# Patient Record
Sex: Male | Born: 1940
Health system: Southern US, Community
[De-identification: ages and names within clinical notes are randomized; demographics above are authoritative.]

## PROBLEM LIST (undated history)

## (undated) DIAGNOSIS — N400 Enlarged prostate without lower urinary tract symptoms: Secondary | ICD-10-CM

## (undated) DIAGNOSIS — I10 Essential (primary) hypertension: Secondary | ICD-10-CM

## (undated) DIAGNOSIS — G8929 Other chronic pain: Secondary | ICD-10-CM

## (undated) DIAGNOSIS — N529 Male erectile dysfunction, unspecified: Secondary | ICD-10-CM

## (undated) DIAGNOSIS — R972 Elevated prostate specific antigen [PSA]: Secondary | ICD-10-CM

## (undated) DIAGNOSIS — M549 Dorsalgia, unspecified: Secondary | ICD-10-CM

## (undated) DIAGNOSIS — R3911 Hesitancy of micturition: Secondary | ICD-10-CM

## (undated) DIAGNOSIS — M1990 Unspecified osteoarthritis, unspecified site: Secondary | ICD-10-CM

## (undated) DIAGNOSIS — R7303 Prediabetes: Secondary | ICD-10-CM

## (undated) HISTORY — DX: Benign prostatic hyperplasia without lower urinary tract symptoms: N40.0

## (undated) HISTORY — DX: Dorsalgia, unspecified: M54.9

## (undated) HISTORY — DX: Other chronic pain: G89.29

## (undated) HISTORY — DX: Elevated prostate specific antigen (PSA): R97.20

## (undated) HISTORY — DX: Essential (primary) hypertension: I10

## (undated) HISTORY — PX: TONSILLECTOMY: SUR1361

## (undated) HISTORY — DX: Male erectile dysfunction, unspecified: N52.9

## (undated) HISTORY — DX: Hesitancy of micturition: R39.11

## (undated) HISTORY — DX: Unspecified osteoarthritis, unspecified site: M19.90

---

## 2006-12-19 ENCOUNTER — Ambulatory Visit: Payer: Self-pay | Admitting: Urology

## 2008-07-16 ENCOUNTER — Ambulatory Visit: Payer: Self-pay | Admitting: Internal Medicine

## 2008-07-18 IMAGING — CT CT ABDOMEN AND PELVIS WITHOUT AND WITH CONTRAST
2 of 4 series · 14 of 32 positions shown, 19 images · non-contrast
Comparison: none

REASON FOR EXAM: hematuria
COMMENTS:

[Series 3: soft tissue with · axial · 0.88mm/px · z∈[-618,-233]mm · 8 of 99 slices shown, 13 images]
[im 11/99  soft-tissue]
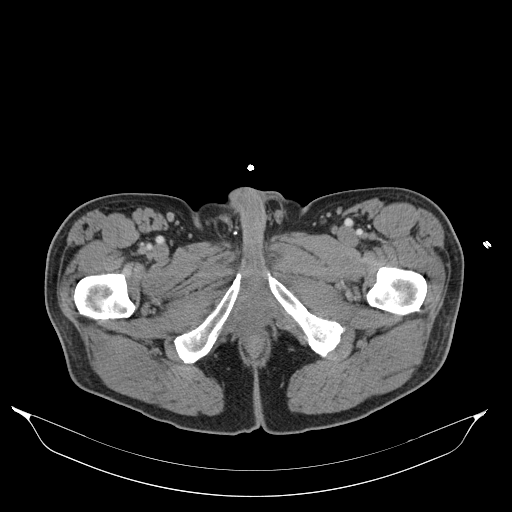
[im 11/99  bone]
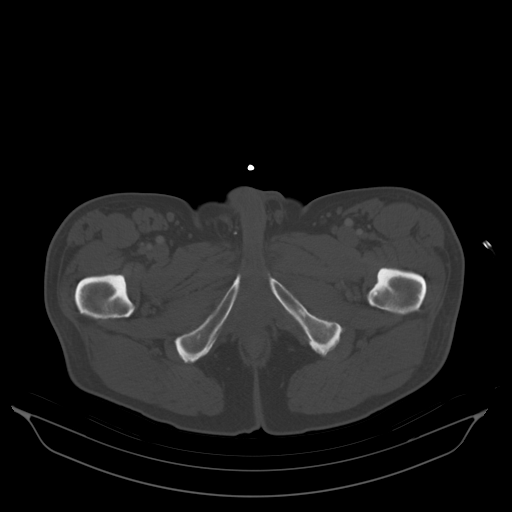
[im 22/99  soft-tissue]
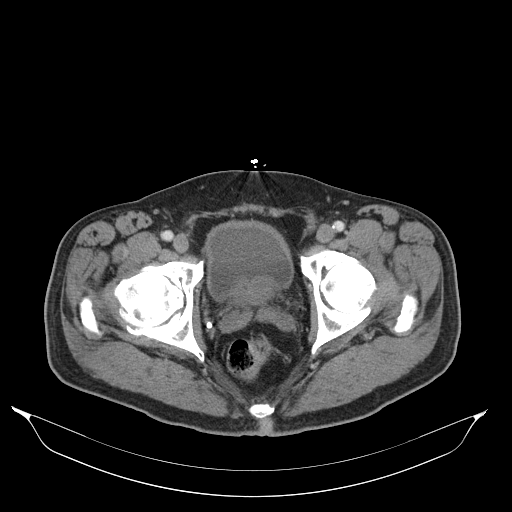
[im 33/99  soft-tissue]
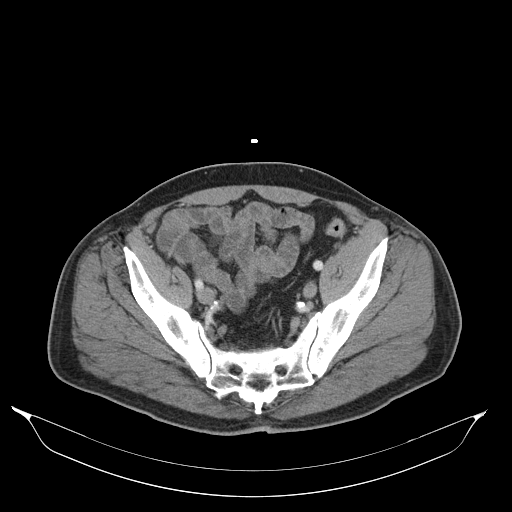
[im 44/99  soft-tissue]
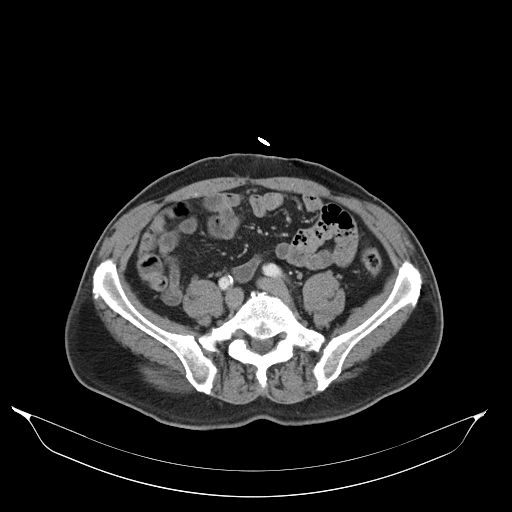
[im 55/99  soft-tissue]
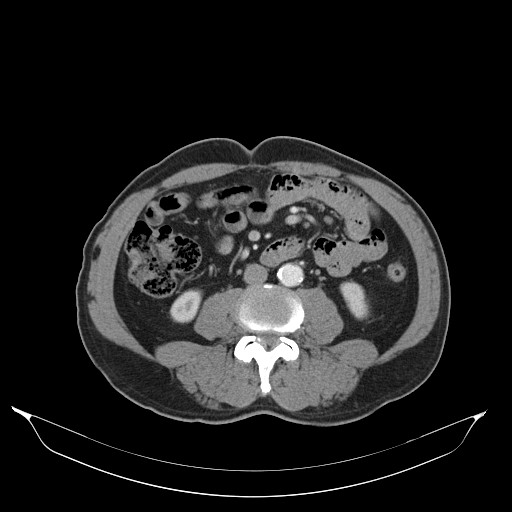
[im 55/99  lung]
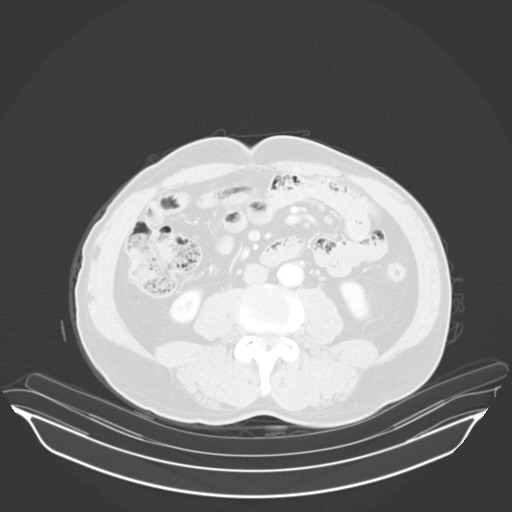
[im 66/99  soft-tissue]
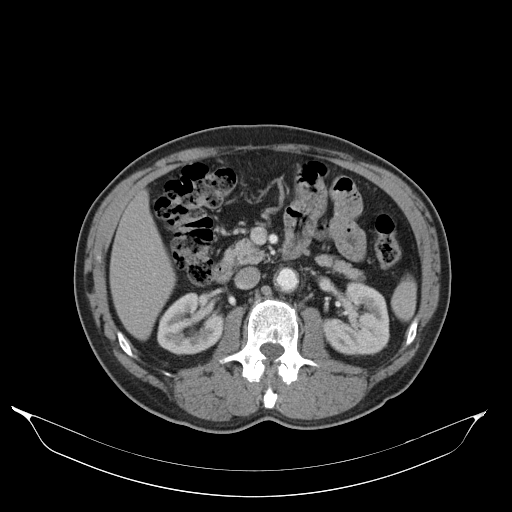
[im 66/99  lung]
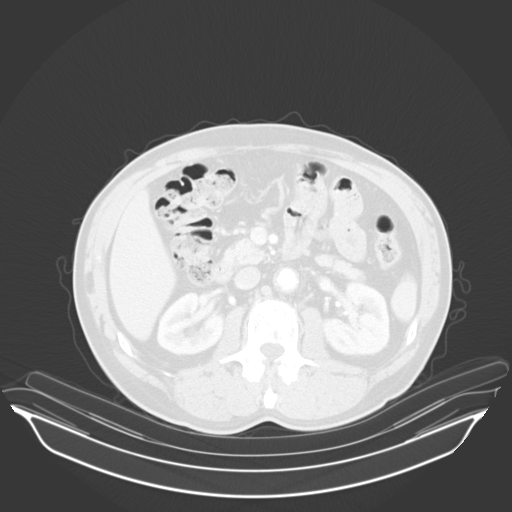
[im 77/99  soft-tissue]
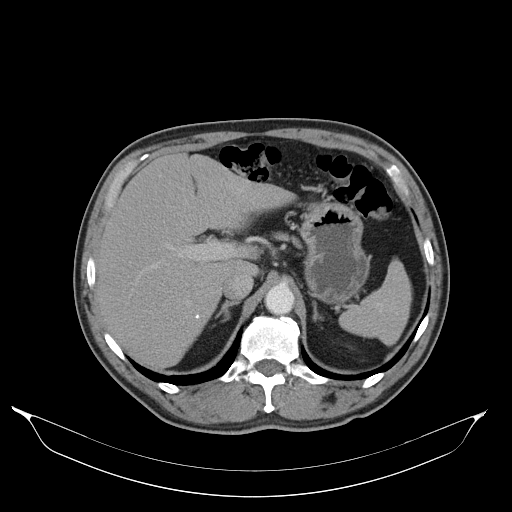
[im 77/99  lung]
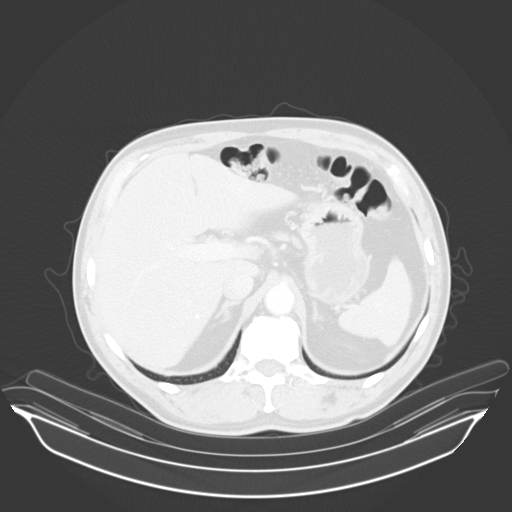
[im 88/99  soft-tissue]
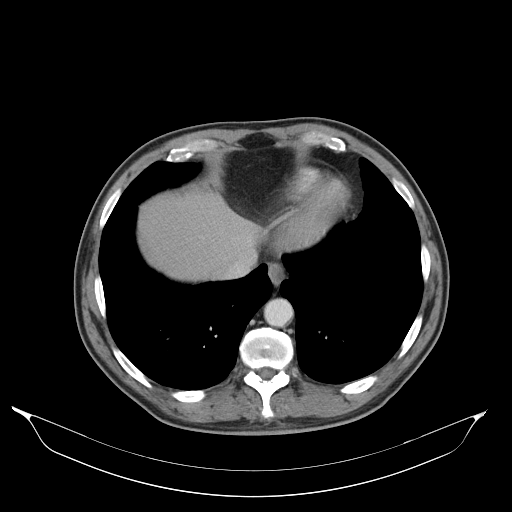
[im 88/99  lung]
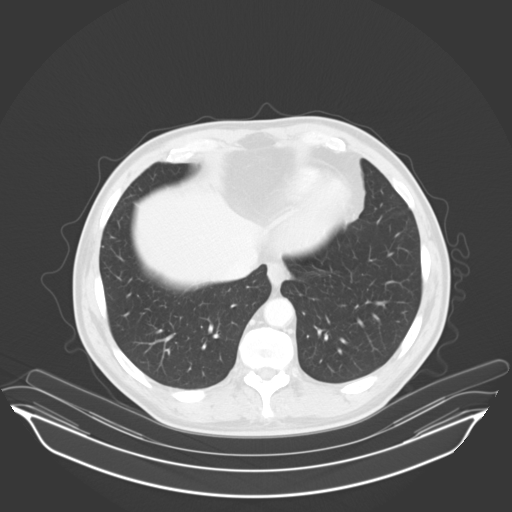

[Series 6: soft tissue delay · axial · delayed · 0.88mm/px · z∈[-618,-343]mm · 6 of 99 slices shown]
[im 11/99  soft-tissue]
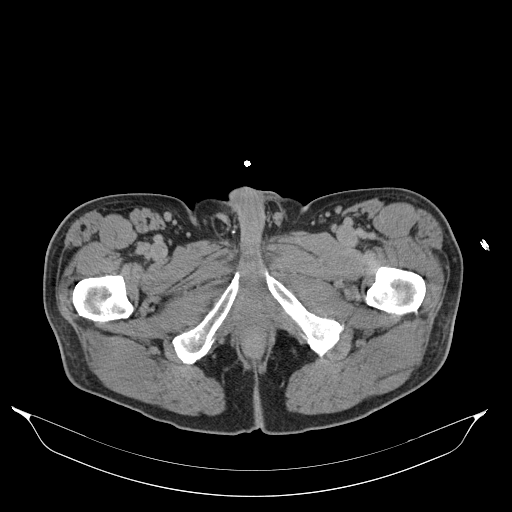
[im 22/99  soft-tissue]
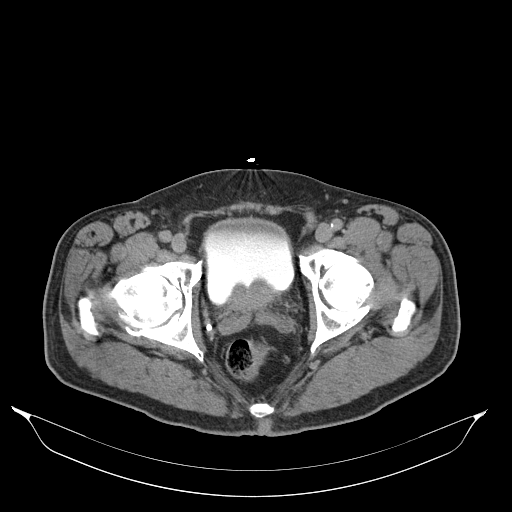
[im 33/99  soft-tissue]
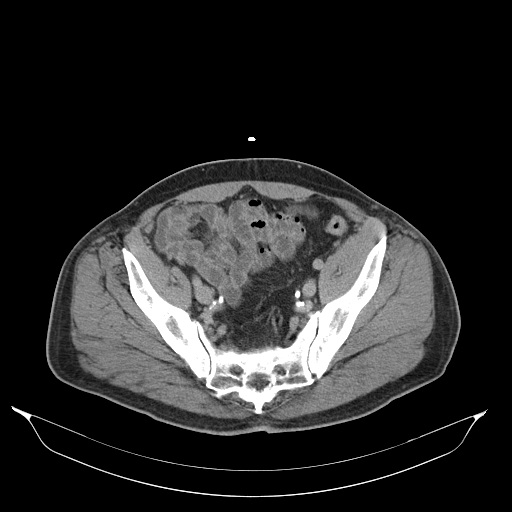
[im 44/99  soft-tissue]
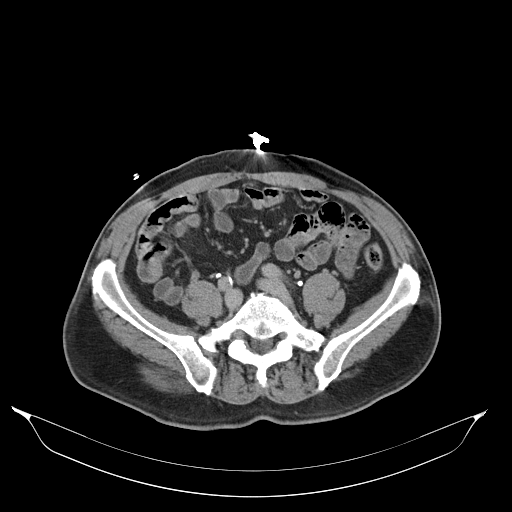
[im 55/99  soft-tissue]
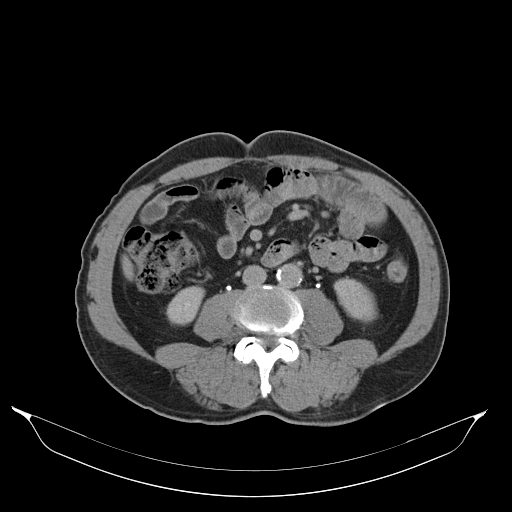
[im 66/99  soft-tissue]
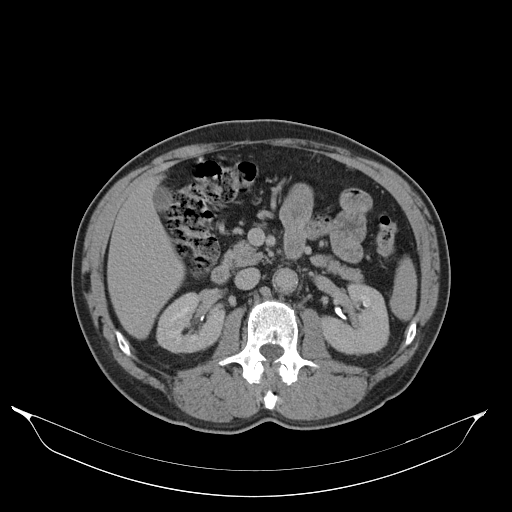

[14 of 32 positions shown; findings below may reference images not displayed]

PROCEDURE:     MARIT JOHANNE - MARIT JOHANNE ABDOMEN / PELVIS W/WO  - December 19, 2006  [DATE]

RESULT:       The patient has unexplained hematuria. On the noncontrast
images, the kidneys are normal in density and contour with no perinephric
inflammatory changes seen. No calcifications are seen within either kidney.
Following contrast administration, the enhancement pattern of the renal
parenchyma bilaterally is normal. On the delayed images, the appearance of
the renal collecting systems is normal bilaterally. The ureters are normal
in course and caliber.

The prostate gland is enlarged and produces a prominent impression upon the
urinary bladder base. The urinary bladder otherwise exhibits no acute
abnormality. There is mild sigmoid diverticulosis with no evidence of acute
diverticulitis. There is no free fluid in the pelvis. The unopacified loops
of small and large bowel exhibit no acute abnormality.

The liver, stomach, spleen, pancreas, and adrenal glands are normal in
appearance. The caliber of the abdominal aorta is within the limits of
normal. There is mild failure to taper of its caliber with calcification
within its walls. The lung bases are clear.
IMPRESSION: 1. I do not see evidence of intrinsic renal abnormality.
2. There is enlargement of the prostate gland which produces a prominent
impression upon the urinary bladder base.
3. There is no evidence of acute hepatobiliary abnormality. The gallbladder
is only partially distended but no calcified stones are seen.
4. No acute bowel abnormality is demonstrated. There is sigmoid
diverticulosis.

## 2014-06-07 ENCOUNTER — Ambulatory Visit: Payer: Self-pay | Admitting: Emergency Medicine

## 2014-06-07 LAB — DOT URINE DIP
Glucose,UR: NEGATIVE
Protein: NEGATIVE
Specific Gravity: 1.015 (ref 1.000–1.030)

## 2014-12-05 ENCOUNTER — Encounter: Payer: Self-pay | Admitting: *Deleted

## 2014-12-08 ENCOUNTER — Encounter: Payer: Self-pay | Admitting: Urology

## 2014-12-08 ENCOUNTER — Ambulatory Visit (INDEPENDENT_AMBULATORY_CARE_PROVIDER_SITE_OTHER): Payer: Medicare Other | Admitting: Urology

## 2014-12-08 VITALS — BP 155/76 | HR 69 | Ht 69.0 in | Wt 181.7 lb

## 2014-12-08 DIAGNOSIS — N401 Enlarged prostate with lower urinary tract symptoms: Secondary | ICD-10-CM | POA: Diagnosis not present

## 2014-12-08 DIAGNOSIS — N138 Other obstructive and reflux uropathy: Secondary | ICD-10-CM

## 2014-12-08 DIAGNOSIS — N529 Male erectile dysfunction, unspecified: Secondary | ICD-10-CM | POA: Insufficient documentation

## 2014-12-08 DIAGNOSIS — R972 Elevated prostate specific antigen [PSA]: Secondary | ICD-10-CM | POA: Diagnosis not present

## 2014-12-08 DIAGNOSIS — N528 Other male erectile dysfunction: Secondary | ICD-10-CM | POA: Diagnosis not present

## 2014-12-08 LAB — BLADDER SCAN AMB NON-IMAGING: Scan Result: 108

## 2014-12-08 MED ORDER — FINASTERIDE 5 MG PO TABS: 5.0000 mg | ORAL_TABLET | Freq: Every day | ORAL | Status: DC

## 2014-12-08 NOTE — Progress Notes (Signed)
12/08/2014 8:42 AM   Austin Ellis 02/04/1941 [id number]  Referring provider: No referring provider defined for this encounter.  Chief Complaint  Patient presents with  . Benign Prostatic Hypertrophy    6 month recheck  . Elevated PSA    HPI: Mr. Stieg is a 75 year old white male with elevated PSA, erectile dysfunction and BPH with LUTS who presents her 6 month follow-up.  His IPS S score today is 11, which is moderate lower urinary tract symptomatology. He is mostly satisfied with his quality life due to his urinary symptoms. His PVR is 108 mL.  His major complaint is in the morning when he wakes up his urinary stream is very weak and he needs to make several trips to the bathroom. As the day progresses his urinary stream becomes stronger and he empties his bladder more efficiently. He is only getting up one time a night which is not bothersome to him at this time. He denies any dysuria, hematuria or suprapubic pain. He also denies any recent fevers, chills, nausea or vomiting.     IPSS      12/08/14 0800       International Prostate Symptom Score   How often have you had the sensation of not emptying your bladder? Less than 1 in 5     How often have you had to urinate less than every two hours? Less than half the time     How often have you found you stopped and started again several times when you urinated? About half the time     How often have you found it difficult to postpone urination? Less than 1 in 5 times     How often have you had a weak urinary stream? About half the time     How often have you had to strain to start urination? Not at All     How many times did you typically get up at night to urinate? 1 Time     Total IPSS Score 11     Quality of Life due to urinary symptoms   If you were to spend the rest of your life with your urinary condition just the way it is now how would you feel about that? Mostly Satisfied        Score:  1-7 Mild 8-19  Moderate 20-35 Severe    He also history of elevated PSA with undergoing a biopsy in January 2009 2015 for a PSA of 5.4 ng/mL. The biopsy was negative.  He does suffer from erectile dysfunction, but his wife's libido is significantly diminished. His SHIM score is 7, which is severe erectile dysfunction. He does not desire any treatment for this condition at this time. His any painful erections or penile curvature.      SHIM      12/08/14 0835       SHIM: Over the last 6 months:   How do you rate your confidence that you could get and keep an erection? Very Low     When you had erections with sexual stimulation, how often were your erections hard enough for penetration (entering your partner)? A Few Times (much less than half the time)     During sexual intercourse, how often were you able to maintain your erection after you had penetrated (entered) your partner? Extremely Difficult     During sexual intercourse, how difficult was it to maintain your erection to completion of intercourse? Extremely Difficult  When you attempted sexual intercourse, how often was it satisfactory for you? Very Difficult     SHIM Total Score   SHIM 7        Score: 1-7 Severe ED 8-11 Moderate ED 12-16 Mild-Moderate ED 17-21 Mild ED 22-25 No ED  PMH: Past Medical History  Diagnosis Date  . Chronic back pain   . Hypertension   . Arthritis   . Elevated PSA     underwent TRUSBx on 05/28/2013 for a PSA of 5.4 ng/ml  . Erectile dysfunction   . Benign enlargement of prostate   . Urinary hesitancy     Surgical History: Past Surgical History  Procedure Laterality Date  . Tonsillectomy      Home Medications:    Medication List       This list is accurate as of: 12/08/14  8:42 AM.  Always use your most recent med list.               hydrochlorothiazide 12.5 MG capsule  Commonly known as:  MICROZIDE  Take 12.5 mg by mouth daily.     ibuprofen 200 MG tablet  Commonly known as:   ADVIL,MOTRIN  Take 200 mg by mouth every 4 (four) hours as needed.     losartan 100 MG tablet  Commonly known as:  COZAAR  Take 100 mg by mouth daily.     tadalafil 5 MG tablet  Commonly known as:  CIALIS  Take 5 mg by mouth daily as needed for erectile dysfunction.     tamsulosin 0.4 MG Caps capsule  Commonly known as:  FLOMAX  Take 0.4 mg by mouth daily.        Allergies:  Allergies  Allergen Reactions  . Cialis [Tadalafil]     Back Pain    Family History: Family History  Problem Relation Age of Onset  . Prostate cancer Father   . Lung cancer Father   . Kidney disease Neg Hx   . Bladder Cancer Neg Hx     Social History:  reports that he has quit smoking. He does not have any smokeless tobacco history on file. He reports that he does not drink alcohol or use illicit drugs.  ROS: UROLOGY Frequent Urination?: No Hard to postpone urination?: No Burning/pain with urination?: No Get up at night to urinate?: No Leakage of urine?: No Urine stream starts and stops?: Yes Trouble starting stream?: No Do you have to strain to urinate?: No Blood in urine?: No Urinary tract infection?: No Sexually transmitted disease?: No Injury to kidneys or bladder?: No Painful intercourse?: No Weak stream?: Yes Erection problems?: Yes Penile pain?: No  Gastrointestinal Nausea?: No Vomiting?: No Indigestion/heartburn?: No Diarrhea?: No Constipation?: No  Constitutional Fever: No Night sweats?: No Weight loss?: No Fatigue?: No  Skin Skin rash/lesions?: No Itching?: No  Eyes Blurred vision?: No Double vision?: No  Ears/Nose/Throat Sore throat?: No Sinus problems?: No  Hematologic/Lymphatic Swollen glands?: No Easy bruising?: No  Cardiovascular Leg swelling?: No Chest pain?: No  Respiratory Cough?: No Shortness of breath?: No  Endocrine Excessive thirst?: No  Musculoskeletal Back pain?: Yes Joint pain?: No  Neurological Headaches?: No Dizziness?:  No  Psychologic Depression?: No Anxiety?: No  Physical Exam: BP 155/76 mmHg  Pulse 69  Ht 5\' 9"  (1.753 m)  Wt 181 lb 11.2 oz (82.419 kg)  BMI 26.82 kg/m2  GU: Patient with uncircumcised phallus. Foreskin easily retracted  Urethral meatus is patent.  No penile discharge. No penile lesions or rashes.  Scrotum without lesions, cysts, rashes and/or edema.  Testicles are located scrotally bilaterally. No masses are appreciated in the testicles. Left and right epididymis are normal.  Rectal: Patient with  normal sphincter tone. Perineum without scarring or rashes. No rectal masses are appreciated. Prostate is approximately 60 grams, no nodules are appreciated. Seminal vesicles are normal.   Laboratory Data: Results for orders placed or performed in visit on 12/08/14  BLADDER SCAN AMB NON-IMAGING  Result Value Ref Range   Scan Result 108    No results found for: WBC, HGB, HCT, MCV, PLT  No results found for: CREATININE  No results found for: PSA  No results found for: TESTOSTERONE  No results found for: HGBA1C  Urinalysis    Component Value Date/Time   LABSPEC 1.015 06/07/2014 [place] 06/07/2014 [place] 06/07/2014 [place] 06/07/2014 0735    Pertinent Imaging:   Assessment & Plan:    1. BPH (benign prostatic hyperplasia) with LUTS:   Patient's IPSS score is 11/2.  His PVR 108.  His DRE demonstrates enlargement.  We will add finasteride to his tamsulosin.  The side effects of dutasteride are also discussed with the patient, such as: impotence, loss of interest in sex, or trouble having an orgasm; abnormal ejaculation; swelling in his hands and/or feet; swelling and/or tenderness in his breasts.  He will follow up in 6 months for a PSA, DRE, PVR and an IPSS.    - PSA - BLADDER SCAN AMB NON-IMAGING  2. Elevated PSA:   He also history of elevated PSA with undergoing a biopsy in January 2009 2015 for a PSA of 5.4 ng/mL. The biopsy  was negative.  We will continue to monitor closely with PSA's every 6 months.  PSA History:    5.4 ng/mL on 05/28/2013- neg bx    4.2 ng/mL on 12/07/2013    4.0 ng/mL on 06/09/2014  3. Erectile dysfunction:   SHIM score is 7.  He does not desire any treatment for this condition at this time. His wife is not interested in sexual activity.  No Follow-up on file.  Zara Council, [facility] [address] [place], [place] [address] [phone]

## 2014-12-09 ENCOUNTER — Telehealth: Payer: Self-pay

## 2014-12-09 DIAGNOSIS — R972 Elevated prostate specific antigen [PSA]: Secondary | ICD-10-CM

## 2014-12-09 LAB — PSA: Prostate Specific Ag, Serum: 4.2 ng/mL — ABNORMAL HIGH (ref 0.0–4.0)

## 2014-12-09 NOTE — Telephone Encounter (Signed)
LMOM with lab results. Orders have been placed for PSA.

## 2014-12-09 NOTE — Telephone Encounter (Signed)
-----   Message from Nori Riis, PA-C sent at 12/09/2014  8:31 AM EDT ----- Patient's PSA is stable.  We will see him in 6 months.  PSA to be drawn before his next appointment.

## 2015-06-06 ENCOUNTER — Other Ambulatory Visit: Payer: Medicare Other

## 2015-06-06 DIAGNOSIS — R972 Elevated prostate specific antigen [PSA]: Secondary | ICD-10-CM

## 2015-06-07 LAB — PSA: Prostate Specific Ag, Serum: 1.9 ng/mL (ref 0.0–4.0)

## 2015-06-13 ENCOUNTER — Encounter: Payer: Self-pay | Admitting: Urology

## 2015-06-13 ENCOUNTER — Ambulatory Visit (INDEPENDENT_AMBULATORY_CARE_PROVIDER_SITE_OTHER): Payer: Medicare Other | Admitting: Urology

## 2015-06-13 VITALS — BP 160/83 | HR 75 | Ht 69.0 in | Wt 181.6 lb

## 2015-06-13 DIAGNOSIS — Z87898 Personal history of other specified conditions: Secondary | ICD-10-CM

## 2015-06-13 DIAGNOSIS — N528 Other male erectile dysfunction: Secondary | ICD-10-CM | POA: Diagnosis not present

## 2015-06-13 DIAGNOSIS — N529 Male erectile dysfunction, unspecified: Secondary | ICD-10-CM

## 2015-06-13 DIAGNOSIS — N138 Other obstructive and reflux uropathy: Secondary | ICD-10-CM

## 2015-06-13 DIAGNOSIS — N401 Enlarged prostate with lower urinary tract symptoms: Secondary | ICD-10-CM

## 2015-06-13 NOTE — Progress Notes (Signed)
11:09 AM   Austin Ellis 1941/02/05 Austin Ellis  Referring provider: Lynnell Jude, MD [address] Austin Ellis, Austin Ellis Austin Ellis  Chief Complaint  Patient presents with  . Benign Prostatic Hypertrophy    6 month follow up  . Erectile Dysfunction    HPI: Austin Ellis is a 75 year old white male with elevated PSA, erectile dysfunction and BPH with LUTS who presents his 6 month follow-up.  History of elevated PSA He also history of elevated PSA with undergoing a biopsy in January 2009 2015 for a PSA of 5.4 ng/mL. The biopsy was negative.  His father was diagnosed with prostate cancer.   His most recent PSA is 1.9 ng/mL on 06/06/2015.  BPH WITH LUTS His IPSS score today is 20, which is severe lower urinary tract symptomatology. He is mixed with his quality life due to his urinary symptoms. His PVR is 92 mL.  His previous IPSS score was 11/2.Marland Kitchen  His previous PVR is 108 mL.  His major complaint today is nocturia 1, urinary intermittency and a weak urinary stream.   These symptoms have worsened over the last year.  He denies any dysuria, hematuria or suprapubic pain.   He currently taking tamsulosin 0.4 mg and finasteride 5 mg daily.  He also denies any recent fevers, chills, nausea or vomiting.        IPSS      06/13/15 1000       International Prostate Symptom Score   How often have you had the sensation of not emptying your bladder? More than half the time     How often have you had to urinate less than every two hours? More than half the time     How often have you found you stopped and started again several times when you urinated? More than half the time     How often have you found it difficult to postpone urination? Less than half the time     How often have you had a weak urinary stream? More than half the time     How often have you had to strain to start urination? Less than 1 in 5 times     How many times did you typically get up at night to urinate? 1 Time     Total IPSS  Score 20     Quality of Life due to urinary symptoms   If you were to spend the rest of your life with your urinary condition just the way it is now how would you feel about that? Mixed        Score:  1-7 Mild 8-19 Moderate 20-35 Severe    Erectile dysfunction His SHIM score is 11, which is moderate erectile dysfunction.   He has been having difficulty with erections for the last three years.   His major complaint is maintaining an erection.  His libido is preserved.   His risk factors for ED are age, hypertension and BPH.  He denies any painful erections or curvatures with his erections.   He has tried Cialis in the past and found it effective. It is cost prohibitive.        SHIM      06/13/15 1044       SHIM: Over the last 6 months:   How do you rate your confidence that you could get and keep an erection? Low     When you had erections with sexual stimulation, how often were your erections  hard enough for penetration (entering your partner)? A Few Times (much less than half the time)     During sexual intercourse, how often were you able to maintain your erection after you had penetrated (entered) your partner? Very Difficult     During sexual intercourse, how difficult was it to maintain your erection to completion of intercourse? Very Difficult     When you attempted sexual intercourse, how often was it satisfactory for you? Difficult     SHIM Total Score   SHIM 11        Score: 1-7 Severe ED 8-11 Moderate ED 12-16 Mild-Moderate ED 17-21 Mild ED 22-25 No ED   PMH: Past Medical History  Diagnosis Date  . Chronic back pain   . Hypertension   . Arthritis   . Elevated PSA     underwent TRUSBx on 05/28/2013 for a PSA of 5.4 ng/ml  . Erectile dysfunction   . Benign enlargement of prostate   . Urinary hesitancy     Surgical History: Past Surgical History  Procedure Laterality Date  . Tonsillectomy      Home Medications:    Medication List       This  list is accurate as of: 06/13/15 11:09 AM.  Always use your most recent med list.               finasteride 5 MG tablet  Commonly known as:  PROSCAR  Take 1 tablet (5 mg total) by mouth daily.     ibuprofen 200 MG tablet  Commonly known as:  ADVIL,MOTRIN  Take 200 mg by mouth every 4 (four) hours as needed.     losartan-hydrochlorothiazide 100-12.5 MG tablet  Commonly known as:  HYZAAR     tamsulosin 0.4 MG Caps capsule  Commonly known as:  FLOMAX  Take 0.4 mg by mouth daily.        Allergies:  Allergies  Allergen Reactions  . Cialis [Tadalafil]     Back Pain    Family History: Family History  Problem Relation Age of Onset  . Prostate cancer Father   . Lung cancer Father   . Kidney disease Neg Hx   . Bladder Cancer Neg Hx     Social History:  reports that he has quit smoking. He does not have any smokeless tobacco history on file. He reports that he does not drink alcohol or use illicit drugs.  ROS: UROLOGY Frequent Urination?: No Hard to postpone urination?: No Burning/pain with urination?: No Get up at night to urinate?: Yes Leakage of urine?: No Urine stream starts and stops?: Yes Trouble starting stream?: No Do you have to strain to urinate?: No Blood in urine?: No Urinary tract infection?: Yes Sexually transmitted disease?: No Injury to kidneys or bladder?: No Painful intercourse?: No Weak stream?: Yes Erection problems?: Yes Penile pain?: No  Gastrointestinal Nausea?: No Vomiting?: No Indigestion/heartburn?: No Diarrhea?: No Constipation?: No  Constitutional Fever: No Night sweats?: No Weight loss?: No Fatigue?: Yes  Skin Skin rash/lesions?: No Itching?: No  Eyes Blurred vision?: No Double vision?: No  Ears/Nose/Throat Sore throat?: No Sinus problems?: No  Hematologic/Lymphatic Swollen glands?: No Easy bruising?: No  Cardiovascular Leg swelling?: No Chest pain?: No  Respiratory Cough?: No Shortness of breath?:  No  Endocrine Excessive thirst?: No  Musculoskeletal Back pain?: Yes Joint pain?: Yes  Neurological Headaches?: No Dizziness?: No  Psychologic Depression?: No Anxiety?: No  Physical Exam: BP 160/83 mmHg  Pulse 75  Ht 5\' 9"  (1.753 m)  Wt 181 lb 9.6 oz (82.373 kg)  BMI 26.81 kg/m2  GU: Patient with uncircumcised phallus. Foreskin easily retracted  Urethral meatus is patent.  No penile discharge. No penile lesions or rashes. Scrotum without lesions, cysts, rashes and/or edema.  Testicles are located scrotally bilaterally. No masses are appreciated in the testicles. Left and right epididymis are normal. Rectal: Patient with  normal sphincter tone. Perineum without scarring or rashes. No rectal masses are appreciated. Prostate is approximately 60 grams, no nodules are appreciated. Seminal vesicles are normal.   Laboratory Data: Results for orders placed or performed in visit on 06/06/15  PSA  Result Value Ref Range   Prostate Specific Ag, Serum 1.9 0.0 - 4.0 ng/mL   PSA History:    5.4 ng/mL on 05/28/2013- neg bx    4.2 ng/mL on 12/07/2013    4.0 ng/mL on 06/09/2014   Pertinent Imaging: Results for TIP, PANZICA (MRN Austin Ellis) as of 06/21/2015 11:50  Ref. Range 06/19/2015 08:18  Scan Result Unknown 92    Assessment & Plan:    1. History of elevated PSA:   He also history of elevated PSA with undergoing a biopsy in January 2009 2015 for a PSA of 5.4 ng/mL. The biopsy was negative.  His father was diagnosed with prostate cancer.  His most recent PSA was 1.9 ng/mL on 06/06/2015. We will continue to monitor closely with PSA's and exams every 6 months.  2. BPH (benign prostatic hyperplasia) with LUTS:   Patient's IPSS score is 20/3.  His PVR 92 mL.   he is still having obstructive urinary symptoms in spite of being on maximal medicinal therapy with tamsulosin and finasteride. He would like to undergo cystoscopic examination for evaluation for a possible bladder outlet  obstruction procedure.  - BLADDER SCAN AMB NON-IMAGING   3. Erectile dysfunction:   SHIM score is 11.  He finds PDE 5 inhibitors cost prohibitive. He is not interested in any other treatment modality at this time.  We will continue to monitor with SHIM score and exam in 6 months.    Return for cystoscopy.  Zara Council, [facility] [address], [place], [place] [address] [phone]

## 2015-06-19 LAB — BLADDER SCAN AMB NON-IMAGING: Scan Result: 92

## 2015-06-27 ENCOUNTER — Other Ambulatory Visit: Payer: Medicare Other

## 2015-11-23 ENCOUNTER — Telehealth: Payer: Self-pay | Admitting: Urology

## 2015-11-23 DIAGNOSIS — N400 Enlarged prostate without lower urinary tract symptoms: Secondary | ICD-10-CM

## 2015-11-23 NOTE — Telephone Encounter (Signed)
Pt needs Finasteride refill to [facility] in [place].  He will run out before his next appt on 7/31.

## 2015-11-28 MED ORDER — FINASTERIDE 5 MG PO TABS: 5.0000 mg | ORAL_TABLET | Freq: Every day | ORAL | Status: DC

## 2015-11-28 NOTE — Telephone Encounter (Signed)
Medication sent to pt pharmacy 

## 2015-12-17 NOTE — Progress Notes (Signed)
8:48 AM   Austin Ellis 06-Feb-1941 [id number]  Referring provider: Lynnell Jude, MD [address] [place], [place] [id number]  Chief Complaint  Patient presents with  . Benign Prostatic Hypertrophy    6 month follow up  . Elevated PSA    HPI: Mr. Austin Ellis is a 75 year old white male with elevated PSA, erectile dysfunction and BPH with LUTS who presents his 6 month follow-up.  History of elevated PSA He also history of elevated PSA with undergoing a biopsy in January 2009 2015 for a PSA of 5.4 ng/mL. The biopsy was negative.  His father was diagnosed with prostate cancer.   His most recent PSA is 1.9 ng/mL on 06/06/2015.  BPH WITH LUTS  His IPSS score today is 19, which is moderate lower urinary tract symptomatology. He is mostly satisfied with his quality life due to his urinary symptoms.  His previous IPSS score was 20/3.  His previous PVR is 92 mL.  His major complaint today is urinary intermittency and a weak urinary stream.   These symptoms have worsened over the last year.  He denies any dysuria, hematuria or suprapubic pain.   He currently taking doxazosin 4 mg and finasteride 5 mg daily.  He feels like he doesn't want to do anything.  He would like to go back to the tamsulosin 0.4 mg daily.  He also denies any recent fevers, chills, nausea or vomiting.  His father was diagnosed with prostate cancer, non-lethal.  Patient was to undergo a cystoscopic examination to evaluate for BOO, but this was not performed due to not wanting to proceed with outlet procedure.      IPSS    Row Name 12/18/15 0800         International Prostate Symptom Score   How often have you had the sensation of not emptying your bladder? About half the time     How often have you had to urinate less than every two hours? More than half the time     How often have you found you stopped and started again several times when you urinated? More than half the time     How often have you found it difficult to  postpone urination? Less than half the time     How often have you had a weak urinary stream? More than half the time     How often have you had to strain to start urination? Less than 1 in 5 times     How many times did you typically get up at night to urinate? 1 Time     Total IPSS Score 19       Quality of Life due to urinary symptoms   If you were to spend the rest of your life with your urinary condition just the way it is now how would you feel about that? Mostly Satisfied        Score:  1-7 Mild 8-19 Moderate 20-35 Severe    Erectile dysfunction His SHIM score is 11, which is moderat erectile dysfunction.   He previous SHIM was 11.   He has been having difficulty with erections for the last four years.   His major complaint is maintaining an erection.  His libido is preserved.   His risk factors for ED are age, hypertension and BPH.  He denies any painful erections or curvatures with his erections.   He has tried Cialis in the past and found it effective. It is  cost prohibitive.        SHIM    Row Name 12/18/15 [phone]         SHIM: Over the last 6 months:   How do you rate your confidence that you could get and keep an erection? Low     When you had erections with sexual stimulation, how often were your erections hard enough for penetration (entering your partner)? A Few Times (much less than half the time)     During sexual intercourse, how often were you able to maintain your erection after you had penetrated (entered) your partner? Very Difficult     During sexual intercourse, how difficult was it to maintain your erection to completion of intercourse? Very Difficult     When you attempted sexual intercourse, how often was it satisfactory for you? Difficult       SHIM Total Score   SHIM 11        Score: 1-7 Severe ED 8-11 Moderate ED 12-16 Mild-Moderate ED 17-21 Mild ED 22-25 No ED   PMH: Past Medical History:  Diagnosis Date  . Arthritis   . Benign  enlargement of prostate   . Chronic back pain   . Elevated PSA    underwent TRUSBx on 05/28/2013 for a PSA of 5.4 ng/ml  . Erectile dysfunction   . Hypertension   . Urinary hesitancy     Surgical History: Past Surgical History:  Procedure Laterality Date  . TONSILLECTOMY      Home Medications:    Medication List       Accurate as of 12/18/15  8:48 AM. Always use your most recent med list.          doxazosin 4 MG tablet Commonly known as:  CARDURA   finasteride 5 MG tablet Commonly known as:  PROSCAR Take 1 tablet (5 mg total) by mouth daily.   ibuprofen 200 MG tablet Commonly known as:  ADVIL,MOTRIN Take 200 mg by mouth every 4 (four) hours as needed.   losartan-hydrochlorothiazide 100-12.5 MG tablet Commonly known as:  HYZAAR   tamsulosin 0.4 MG Caps capsule Commonly known as:  FLOMAX Take 1 capsule (0.4 mg total) by mouth daily.       Allergies:  Allergies  Allergen Reactions  . Cialis [Tadalafil]     Back Pain    Family History: Family History  Problem Relation Age of Onset  . Prostate cancer Father   . Lung cancer Father   . Kidney disease Neg Hx   . Bladder Cancer Neg Hx     Social History:  reports that he has quit smoking. He has never used smokeless tobacco. He reports that he does not drink alcohol or use drugs.  ROS: UROLOGY Frequent Urination?: No Hard to postpone urination?: No Burning/pain with urination?: No Get up at night to urinate?: No Leakage of urine?: No Urine stream starts and stops?: Yes Trouble starting stream?: No Do you have to strain to urinate?: No Blood in urine?: No Urinary tract infection?: No Sexually transmitted disease?: No Injury to kidneys or bladder?: No Painful intercourse?: No Weak stream?: Yes Erection problems?: No Penile pain?: No  Gastrointestinal Nausea?: No Vomiting?: No Indigestion/heartburn?: No Diarrhea?: No Constipation?: No  Constitutional Fever: No Night sweats?: No Weight  loss?: No Fatigue?: Yes  Skin Skin rash/lesions?: No Itching?: No  Eyes Blurred vision?: No Double vision?: No  Ears/Nose/Throat Sore throat?: No Sinus problems?: No  Hematologic/Lymphatic Swollen glands?: No Easy bruising?: No  Cardiovascular Leg swelling?:  No Chest pain?: No  Respiratory Cough?: No Shortness of breath?: No  Endocrine Excessive thirst?: No  Musculoskeletal Back pain?: No Joint pain?: Yes  Neurological Headaches?: No Dizziness?: No  Psychologic Depression?: No Anxiety?: No  Physical Exam: BP (!) 147/69   Pulse 75   Ht 5\' 9"  (1.753 m)   Wt 189 lb 9.6 oz (86 kg)   BMI 28.00 kg/m   Constitutional: Well nourished. Alert and oriented, No acute distress. HEENT: [place] AT, moist mucus membranes. Trachea midline, no masses. Cardiovascular: No clubbing, cyanosis, or edema. Respiratory: Normal respiratory effort, no increased work of breathing. GI: Abdomen is soft, non tender, non distended, no abdominal masses. Liver and spleen not palpable.  No hernias appreciated.  Stool sample for occult testing is not indicated.   GU: No CVA tenderness.  No bladder fullness or masses.  Patient with uncircumcised phallus. Foreskin easily retracted Urethral meatus is patent.  No penile discharge. No penile lesions or rashes. Scrotum without lesions, cysts, rashes and/or edema.  Testicles are located scrotally bilaterally. No masses are appreciated in the testicles. Left and right epididymis are normal. Rectal: Patient with  normal sphincter tone. Anus and perineum without scarring or rashes. No rectal masses are appreciated. Prostate is approximately 55 grams, firm, no nodules are appreciated. Seminal vesicles are normal. Skin: No rashes, bruises or suspicious lesions. Lymph: No cervical or inguinal adenopathy. Neurologic: Grossly intact, no focal deficits, moving all 4 extremities. Psychiatric: Normal mood and affect.  Laboratory Data: PSA History:    5.4 ng/mL  on 05/28/2013- neg bx    4.2 ng/mL on 12/07/2013    4.0 ng/mL on 06/09/2014    1.9 ng/mL on 06/06/2015    Assessment & Plan:    1. History of elevated PSA:   He also history of elevated PSA with undergoing a biopsy in January 2015 for a PSA of 5.4 ng/mL. The biopsy was negative.  His father was diagnosed with prostate cancer, non-lethal.  His most recent PSA was 1.9 ng/mL on 06/06/2015.  We will continue to monitor closely with PSA's and exams every 6 months.  PSA drawn today.    2. Family history of prostate cancer:   See above.    3. BPH with LUTS  - IPSS score is 19/2, it is stable  - Continue conservative management, avoiding bladder irritants and timed voiding's  - Continue finasteride 5 mg daily  - Discontinue doxazosin, restart tamsulosin 0.4 mg daily  - Cannot tolerate medication or medication failure, schedule cystoscopy  - RTC in 6 months for IPSS, PSA and exam   4. Erectile dysfunction:     - SHIM score is 11  - Continue Cialis, not sexually active at this time  - RTC in 6 months for repeat SHIM score and exam  Return in about 6 months (around 06/19/2016) for IPSS, PSA and exam.  Zara Council, [facility]  [place] [address] [place], [place] [address] [phone]

## 2015-12-18 ENCOUNTER — Encounter: Payer: Self-pay | Admitting: Urology

## 2015-12-18 ENCOUNTER — Ambulatory Visit (INDEPENDENT_AMBULATORY_CARE_PROVIDER_SITE_OTHER): Payer: Medicare Other | Admitting: Urology

## 2015-12-18 VITALS — BP 147/69 | HR 75 | Ht 69.0 in | Wt 189.6 lb

## 2015-12-18 DIAGNOSIS — Z87898 Personal history of other specified conditions: Secondary | ICD-10-CM | POA: Diagnosis not present

## 2015-12-18 DIAGNOSIS — N401 Enlarged prostate with lower urinary tract symptoms: Secondary | ICD-10-CM | POA: Diagnosis not present

## 2015-12-18 DIAGNOSIS — N138 Other obstructive and reflux uropathy: Secondary | ICD-10-CM

## 2015-12-18 DIAGNOSIS — Z8042 Family history of malignant neoplasm of prostate: Secondary | ICD-10-CM

## 2015-12-18 DIAGNOSIS — N529 Male erectile dysfunction, unspecified: Secondary | ICD-10-CM

## 2015-12-18 DIAGNOSIS — N528 Other male erectile dysfunction: Secondary | ICD-10-CM | POA: Diagnosis not present

## 2015-12-18 MED ORDER — TAMSULOSIN HCL 0.4 MG PO CAPS: 0.4000 mg | ORAL_CAPSULE | Freq: Every day | ORAL | 4 refills | Status: DC

## 2015-12-19 ENCOUNTER — Telehealth: Payer: Self-pay | Admitting: *Deleted

## 2015-12-19 LAB — PSA: Prostate Specific Ag, Serum: 1.6 ng/mL (ref 0.0–4.0)

## 2015-12-19 NOTE — Telephone Encounter (Signed)
-----   Message from Nori Riis, PA-C sent at 12/19/2015  7:51 AM EDT ----- Please notify the patient that his PSA is stable.  We will see him in 6 months.

## 2015-12-19 NOTE — Telephone Encounter (Signed)
Spoke with patient and gave results. Follow up 6 months. Patient ok with plan and appointment already made.

## 2016-06-11 ENCOUNTER — Other Ambulatory Visit: Payer: Self-pay

## 2016-06-11 DIAGNOSIS — N401 Enlarged prostate with lower urinary tract symptoms: Secondary | ICD-10-CM

## 2016-06-12 ENCOUNTER — Other Ambulatory Visit: Payer: PPO

## 2016-06-12 DIAGNOSIS — N401 Enlarged prostate with lower urinary tract symptoms: Secondary | ICD-10-CM | POA: Diagnosis not present

## 2016-06-13 LAB — PSA: PROSTATE SPECIFIC AG, SERUM: 2.1 ng/mL (ref 0.0–4.0)

## 2016-06-18 NOTE — Progress Notes (Signed)
8:46 AM   [name] September 07, 1940 [id number]  Referring provider: Lynnell Jude, MD [address] [place], [place] [id number]  Chief Complaint  Patient presents with  . Benign Prostatic Hypertrophy    6 month follow up   . Elevated PSA    HPI: Mr. Merfeld is a 76 year old Caucasian male with elevated PSA, erectile dysfunction and BPH with LUTS who presents his 6 month follow-up.  History of elevated PSA Patient underwent a biopsy in January 2009 2015 for a PSA of 5.4 ng/mL. The biopsy was negative.  His father was diagnosed with prostate cancer.   His most recent PSA is 2.1 ng/mL on 06/12/2016.    BPH WITH LUTS  His IPSS score today is 15, which is moderate lower urinary tract symptomatology. He is mixed with his quality life due to his urinary symptoms.  His PVR is 0 mL.  His previous IPSS score was 19/2.  His previous PVR is 92 mL.  His major complaints today are urinary intermittency, hesitancy and a weak urinary stream.   These symptoms have worsened over the two years.  He denies any dysuria, hematuria or suprapubic pain.   He is currently taking tamsulosin 0.4 mg and finasteride 5 mg daily.  He also denies any recent fevers, chills, nausea or vomiting.  His father was diagnosed with prostate cancer, non-lethal.  Patient was to undergo a cystoscopic examination to evaluate for BOO, but this was not performed due to not wanting to proceed with outlet procedure.     IPSS    Row Name 06/19/16 0800         International Prostate Symptom Score   How often have you had the sensation of not emptying your bladder? About half the time     How often have you had to urinate less than every two hours? About half the time     How often have you found you stopped and started again several times when you urinated? About half the time     How often have you found it difficult to postpone urination? Less than 1 in 5 times     How often have you had a weak urinary stream? More than half the  time     How often have you had to strain to start urination? Not at All     How many times did you typically get up at night to urinate? 1 Time     Total IPSS Score 15       Quality of Life due to urinary symptoms   If you were to spend the rest of your life with your urinary condition just the way it is now how would you feel about that? Mixed        Score:  1-7 Mild 8-19 Moderate 20-35 Severe   Erectile dysfunction His SHIM score is 14, which is mild to moderat erectile dysfunction.   He previous SHIM was 11.   He has been having difficulty with erections for the last four years.   His major complaint is maintaining an erection.  His libido is preserved.   His risk factors for ED are age, hypertension and BPH.  He denies any painful erections or curvatures with his erections.   He has tried Cialis in the past and found it effective. It is cost prohibitive.     SHIM    Row Name 06/19/16 0834         SHIM: Over  the last 6 months:   How do you rate your confidence that you could get and keep an erection? Very Low     When you had erections with sexual stimulation, how often were your erections hard enough for penetration (entering your partner)? A Few Times (much less than half the time)     During sexual intercourse, how often were you able to maintain your erection after you had penetrated (entered) your partner? Difficult     During sexual intercourse, how difficult was it to maintain your erection to completion of intercourse? Slightly Difficult     When you attempted sexual intercourse, how often was it satisfactory for you? Slightly Difficult       SHIM Total Score   SHIM 14        Score: 1-7 Severe ED 8-11 Moderate ED 12-16 Mild-Moderate ED 17-21 Mild ED 22-25 No ED   PMH: Past Medical History:  Diagnosis Date  . Arthritis   . Benign enlargement of prostate   . Chronic back pain   . Elevated PSA    underwent TRUSBx on 05/28/2013 for a PSA of 5.4 ng/ml  .  Erectile dysfunction   . Hypertension   . Urinary hesitancy     Surgical History: Past Surgical History:  Procedure Laterality Date  . TONSILLECTOMY      Home Medications:  Allergies as of 06/19/2016      Reactions   Cialis [tadalafil]    Back Pain      Medication List       Accurate as of 06/19/16  8:46 AM. Always use your most recent med list.          acetaminophen 325 MG tablet Commonly known as:  TYLENOL Take 650 mg by mouth every 6 (six) hours as needed.   doxazosin 4 MG tablet Commonly known as:  CARDURA   finasteride 5 MG tablet Commonly known as:  PROSCAR Take 1 tablet (5 mg total) by mouth daily.   ibuprofen 200 MG tablet Commonly known as:  ADVIL,MOTRIN Take 200 mg by mouth every 4 (four) hours as needed.   losartan-hydrochlorothiazide 100-12.5 MG tablet Commonly known as:  HYZAAR   tamsulosin 0.4 MG Caps capsule Commonly known as:  FLOMAX Take 1 capsule (0.4 mg total) by mouth daily.       Allergies:  Allergies  Allergen Reactions  . Cialis [Tadalafil]     Back Pain    Family History: Family History  Problem Relation Age of Onset  . Prostate cancer Father   . Lung cancer Father   . Kidney disease Neg Hx   . Bladder Cancer Neg Hx     Social History:  reports that he has quit smoking. He has never used smokeless tobacco. He reports that he does not drink alcohol or use drugs.  ROS: UROLOGY Frequent Urination?: No Hard to postpone urination?: No Burning/pain with urination?: No Get up at night to urinate?: No Leakage of urine?: No Urine stream starts and stops?: Yes Trouble starting stream?: Yes Do you have to strain to urinate?: No Blood in urine?: No Urinary tract infection?: No Sexually transmitted disease?: No Injury to kidneys or bladder?: No Painful intercourse?: No Weak stream?: Yes Erection problems?: No Penile pain?: No  Gastrointestinal Nausea?: No Vomiting?: No Indigestion/heartburn?: No Diarrhea?:  No Constipation?: No  Constitutional Fever: No Night sweats?: No Weight loss?: No Fatigue?: No  Skin Skin rash/lesions?: No Itching?: No  Eyes Blurred vision?: No Double vision?: No  Ears/Nose/Throat  Sore throat?: No Sinus problems?: No  Hematologic/Lymphatic Swollen glands?: No Easy bruising?: No  Cardiovascular Leg swelling?: No Chest pain?: No  Respiratory Cough?: No Shortness of breath?: No  Endocrine Excessive thirst?: No  Musculoskeletal Back pain?: No Joint pain?: No  Neurological Headaches?: No Dizziness?: No  Psychologic Depression?: No Anxiety?: No  Physical Exam: BP (!) 142/71   Pulse 72   Ht 5\' 9"  (1.753 m)   Wt 191 lb 3.2 oz (86.7 kg)   BMI 28.24 kg/m   Constitutional: Well nourished. Alert and oriented, No acute distress. HEENT: [place] AT, moist mucus membranes. Trachea midline, no masses. Cardiovascular: No clubbing, cyanosis, or edema. Respiratory: Normal respiratory effort, no increased work of breathing. GI: Abdomen is soft, non tender, non distended, no abdominal masses. Liver and spleen not palpable.  No hernias appreciated.  Stool sample for occult testing is not indicated.   GU: No CVA tenderness.  No bladder fullness or masses.  Patient with uncircumcised phallus. Foreskin easily retracted  Urethral meatus is patent.  No penile discharge. No penile lesions or rashes. Scrotum without lesions, cysts, rashes and/or edema.  Testicles are located scrotally bilaterally. No masses are appreciated in the testicles. Left and right epididymis are normal. Rectal: Patient with  normal sphincter tone. Anus and perineum without scarring or rashes. No rectal masses are appreciated. Prostate is approximately 55 grams, firm, no nodules are appreciated. Seminal vesicles are normal. Skin: No rashes, bruises or suspicious lesions. Lymph: No cervical or inguinal adenopathy. Neurologic: Grossly intact, no focal deficits, moving all 4  extremities. Psychiatric: Normal mood and affect.  Laboratory Data: PSA History:     5.4 ng/mL on 05/28/2013- neg bx     4.2 ng/mL on 12/07/2013     4.0 ng/mL on 06/09/2014     1.9 ng/mL on 06/06/2015     1.6 ng/mL on 12/18/2015  2.1 ng/mL on 06/12/2016  Assessment & Plan:    1. History of elevated PSA  - underwent a biopsy in January 2015 for a PSA of 5.4 ng/mL - negative  - most recent PSA was 2.1 ng/mL on 06/12/2016  - RTC in 6 months for PSA and exam  2. Family history of prostate cancer  - father was diagnosed with non-lethal prostate cancer  3. BPH with LUTS  - IPSS score is 15/3, it is worsening  - Continue conservative management, avoiding bladder irritants and timed voiding's  - Continue finasteride 5 mg and tamsulosin 0.4 mg daily; refills given  - Also on Cardura 4, 1/2 daily by PCP  - RTC in 6 months for IPSS, PSA and exam   4. Erectile dysfunction:     - SHIM score is 14  - not sexually active at this time   Return in about 6 months (around 12/17/2016) for PSA, IPSS, SHIM and exam.  Zara Council, [facility]  [place] [address] [place],  [address] [phone]

## 2016-06-19 ENCOUNTER — Ambulatory Visit: Payer: PPO | Admitting: Urology

## 2016-06-19 ENCOUNTER — Other Ambulatory Visit: Payer: Self-pay | Admitting: Family Medicine

## 2016-06-19 ENCOUNTER — Encounter: Payer: Self-pay | Admitting: Urology

## 2016-06-19 VITALS — BP 142/71 | HR 72 | Ht 69.0 in | Wt 191.2 lb

## 2016-06-19 DIAGNOSIS — Z8042 Family history of malignant neoplasm of prostate: Secondary | ICD-10-CM | POA: Diagnosis not present

## 2016-06-19 DIAGNOSIS — N138 Other obstructive and reflux uropathy: Secondary | ICD-10-CM | POA: Diagnosis not present

## 2016-06-19 DIAGNOSIS — N529 Male erectile dysfunction, unspecified: Secondary | ICD-10-CM

## 2016-06-19 DIAGNOSIS — R972 Elevated prostate specific antigen [PSA]: Secondary | ICD-10-CM

## 2016-06-19 DIAGNOSIS — Z87898 Personal history of other specified conditions: Secondary | ICD-10-CM | POA: Diagnosis not present

## 2016-06-19 DIAGNOSIS — N400 Enlarged prostate without lower urinary tract symptoms: Secondary | ICD-10-CM

## 2016-06-19 DIAGNOSIS — N401 Enlarged prostate with lower urinary tract symptoms: Secondary | ICD-10-CM

## 2016-06-19 LAB — BLADDER SCAN AMB NON-IMAGING: SCAN RESULT: 0

## 2016-06-19 MED ORDER — TAMSULOSIN HCL 0.4 MG PO CAPS: 0.4000 mg | ORAL_CAPSULE | Freq: Every day | ORAL | 3 refills | Status: DC

## 2016-06-19 MED ORDER — FINASTERIDE 5 MG PO TABS: 5.0000 mg | ORAL_TABLET | Freq: Every day | ORAL | 3 refills | Status: DC

## 2016-09-12 DIAGNOSIS — Z23 Encounter for immunization: Secondary | ICD-10-CM | POA: Diagnosis not present

## 2016-09-12 DIAGNOSIS — N400 Enlarged prostate without lower urinary tract symptoms: Secondary | ICD-10-CM | POA: Diagnosis not present

## 2016-09-12 DIAGNOSIS — Z79899 Other long term (current) drug therapy: Secondary | ICD-10-CM | POA: Diagnosis not present

## 2016-09-12 DIAGNOSIS — H9193 Unspecified hearing loss, bilateral: Secondary | ICD-10-CM | POA: Diagnosis not present

## 2016-09-12 DIAGNOSIS — Z1389 Encounter for screening for other disorder: Secondary | ICD-10-CM | POA: Diagnosis not present

## 2016-09-12 DIAGNOSIS — I10 Essential (primary) hypertension: Secondary | ICD-10-CM | POA: Diagnosis not present

## 2016-09-12 DIAGNOSIS — Z6828 Body mass index (BMI) 28.0-28.9, adult: Secondary | ICD-10-CM | POA: Diagnosis not present

## 2016-09-12 DIAGNOSIS — F17211 Nicotine dependence, cigarettes, in remission: Secondary | ICD-10-CM | POA: Diagnosis not present

## 2016-09-12 DIAGNOSIS — Z0001 Encounter for general adult medical examination with abnormal findings: Secondary | ICD-10-CM | POA: Diagnosis not present

## 2016-10-07 DIAGNOSIS — Z1212 Encounter for screening for malignant neoplasm of rectum: Secondary | ICD-10-CM | POA: Diagnosis not present

## 2016-10-07 DIAGNOSIS — Z1211 Encounter for screening for malignant neoplasm of colon: Secondary | ICD-10-CM | POA: Diagnosis not present

## 2016-11-26 DIAGNOSIS — H2513 Age-related nuclear cataract, bilateral: Secondary | ICD-10-CM | POA: Diagnosis not present

## 2016-12-05 ENCOUNTER — Other Ambulatory Visit
Admission: RE | Admit: 2016-12-05 | Discharge: 2016-12-05 | Disposition: A | Discharge status: Home / Self Care | Payer: PPO | Source: Ambulatory Visit | Attending: Urology | Admitting: Urology

## 2016-12-05 DIAGNOSIS — R972 Elevated prostate specific antigen [PSA]: Secondary | ICD-10-CM

## 2016-12-05 LAB — PSA: Prostatic Specific Antigen: 1.52 ng/mL (ref 0.00–4.00)

## 2016-12-05 NOTE — Addendum Note (Signed)
Addended by: Elmo Putt on: 12/05/2016 08:58 AM   Modules accepted: Orders

## 2016-12-12 NOTE — Progress Notes (Signed)
8:48 AM   Austin Ellis Feb 21, 1941 [id number]  Referring provider: Lynnell Jude, MD [address] [place], [place] [address]  Chief Complaint  Patient presents with  . Benign Prostatic Hypertrophy    6 month follow up  . Elevated PSA    HPI: Mr. Austin Ellis is a 76 year old Caucasian male with a history of elevated PSA, erectile dysfunction and BPH with LUTS who presents his 6 month follow-up.  History of elevated PSA Patient underwent a biopsy in January 2009 2015 for a PSA of 5.4 ng/mL. The biopsy was negative.  His father was diagnosed with prostate cancer.   His most recent PSA is 1.52 ng/mL on 12/05/2016.    BPH WITH LUTS  His IPSS score today is 19, which is moderate lower urinary tract symptomatology. He is mixed with his quality life due to his urinary symptoms.  His PVR is 29 mL.  His previous IPSS score was 15/3.  His previous PVR is 0 mL.  His major complaints today are urinary intermittency and a weak urinary stream.   These symptoms have worsened over the two years.  He denies any dysuria, hematuria or suprapubic pain.   He is currently taking tamsulosin 0.4 mg and finasteride 5 mg daily.  He also denies any recent fevers, chills, nausea or vomiting.  His father was diagnosed with prostate cancer, non-lethal.  Patient was to undergo a cystoscopic examination to evaluate for BOO, but this was not performed due to not wanting to proceed with outlet procedure.     IPSS    Row Name 12/13/16 0800         International Prostate Symptom Score   How often have you had the sensation of not emptying your bladder? About half the time     How often have you had to urinate less than every two hours? About half the time     How often have you found you stopped and started again several times when you urinated? More than half the time     How often have you found it difficult to postpone urination? Less than half the time     How often have you had a weak urinary stream? More than  half the time     How often have you had to strain to start urination? Less than half the time     How many times did you typically get up at night to urinate? 1 Time     Total IPSS Score 19       Quality of Life due to urinary symptoms   If you were to spend the rest of your life with your urinary condition just the way it is now how would you feel about that? Mixed        Score:  1-7 Mild 8-19 Moderate 20-35 Severe   Erectile dysfunction His SHIM score is 5, which is severe erectile dysfunction.   He previous SHIM was 14.   He has been having difficulty with erections for the last four years.   His major complaint is maintaining an erection.  His libido is preserved.   His risk factors for ED are age, hypertension and BPH.  He denies any painful erections or curvatures with his erections.   He has tried Cialis in the past and found it effective. It is cost prohibitive.     Austin Ellis Name 12/13/16 0836         SHIM:  Over the last 6 months:   How do you rate your confidence that you could get and keep an erection? Very Low     When you had erections with sexual stimulation, how often were your erections hard enough for penetration (entering your partner)? Almost Never or Never     During sexual intercourse, how often were you able to maintain your erection after you had penetrated (entered) your partner? Almost Never or Never     During sexual intercourse, how difficult was it to maintain your erection to completion of intercourse? Extremely Difficult     When you attempted sexual intercourse, how often was it satisfactory for you? Almost Never or Never       SHIM Total Score   SHIM 5        Score: 1-7 Severe ED 8-11 Moderate ED 12-16 Mild-Moderate ED 17-21 Mild ED 22-25 No ED   PMH: Past Medical History:  Diagnosis Date  . Arthritis   . Benign enlargement of prostate   . Chronic back pain   . Elevated PSA    underwent TRUSBx on 05/28/2013 for a PSA of 5.4 ng/ml    . Erectile dysfunction   . Hypertension   . Urinary hesitancy     Surgical History: Past Surgical History:  Procedure Laterality Date  . TONSILLECTOMY      Home Medications:  Allergies as of 12/13/2016      Reactions   Cialis [tadalafil]    Back Pain      Medication List       Accurate as of 12/13/16  8:48 AM. Always use your most recent med list.          acetaminophen 325 MG tablet Commonly known as:  TYLENOL Take 650 mg by mouth every 6 (six) hours as needed.   doxazosin 4 MG tablet Commonly known as:  CARDURA 1 mg.   finasteride 5 MG tablet Commonly known as:  PROSCAR Take 1 tablet (5 mg total) by mouth daily.   ibuprofen 200 MG tablet Commonly known as:  ADVIL,MOTRIN Take 200 mg by mouth every 4 (four) hours as needed.   losartan-hydrochlorothiazide 100-12.5 MG tablet Commonly known as:  HYZAAR   tamsulosin 0.4 MG Caps capsule Commonly known as:  FLOMAX Take 1 capsule (0.4 mg total) by mouth daily.       Allergies:  Allergies  Allergen Reactions  . Cialis [Tadalafil]     Back Pain    Family History: Family History  Problem Relation Age of Onset  . Prostate cancer Father   . Lung cancer Father   . Kidney disease Neg Hx   . Bladder Cancer Neg Hx   . Kidney cancer Neg Hx     Social History:  reports that he has quit smoking. He has never used smokeless tobacco. He reports that he does not drink alcohol or use drugs.  ROS: UROLOGY Frequent Urination?: No Hard to postpone urination?: No Burning/pain with urination?: No Get up at night to urinate?: No Leakage of urine?: No Urine stream starts and stops?: Yes Trouble starting stream?: No Do you have to strain to urinate?: No Blood in urine?: No Urinary tract infection?: No Sexually transmitted disease?: No Injury to kidneys or bladder?: No Painful intercourse?: No Weak stream?: Yes Erection problems?: No Penile pain?: No  Gastrointestinal Nausea?: No Vomiting?:  No Indigestion/heartburn?: No Diarrhea?: No Constipation?: No  Constitutional Fever: No Night sweats?: No Weight loss?: No Fatigue?: No  Skin Skin rash/lesions?: No Itching?:  No  Eyes Blurred vision?: No Double vision?: No  Ears/Nose/Throat Sore throat?: No Sinus problems?: No  Hematologic/Lymphatic Swollen glands?: No Easy bruising?: No  Cardiovascular Leg swelling?: No Chest pain?: No  Respiratory Cough?: No Shortness of breath?: No  Endocrine Excessive thirst?: No  Musculoskeletal Back pain?: No Joint pain?: Yes  Neurological Headaches?: No Dizziness?: No  Psychologic Depression?: No Anxiety?: No  Physical Exam: BP (!) 142/75   Pulse 65   Ht 5\' 9"  (1.753 m)   Wt 184 lb 8 oz (83.7 kg)   BMI 27.25 kg/m   Constitutional: Well nourished. Alert and oriented, No acute distress. HEENT: [place] AT, moist mucus membranes. Trachea midline, no masses. Cardiovascular: No clubbing, cyanosis, or edema. Respiratory: Normal respiratory effort, no increased work of breathing. GI: Abdomen is soft, non tender, non distended, no abdominal masses. Liver and spleen not palpable.  No hernias appreciated.  Stool sample for occult testing is not indicated.   GU: No CVA tenderness.  No bladder fullness or masses.  Patient with uncircumcised phallus. Foreskin easily retracted  Urethral meatus is patent.  No penile discharge. No penile lesions or rashes. Scrotum without lesions, cysts, rashes and/or edema.  Testicles are located scrotally bilaterally. No masses are appreciated in the testicles. Left and right epididymis are normal. Rectal: Patient with  normal sphincter tone. Anus and perineum without scarring or rashes. No rectal masses are appreciated. Prostate is approximately 55 grams, firm, no nodules are appreciated. Seminal vesicles are normal. Skin: No rashes, bruises or suspicious lesions. Lymph: No cervical or inguinal adenopathy. Neurologic: Grossly intact, no focal  deficits, moving all 4 extremities. Psychiatric: Normal mood and affect.  Laboratory Data: PSA History:     5.4 ng/mL on 05/28/2013- neg bx     4.2 ng/mL on 12/07/2013     4.0 ng/mL on 06/09/2014     1.9 ng/mL on 06/06/2015     1.6 ng/mL on 12/18/2015  2.1 ng/mL on 06/12/2016  1.52 ng/mL on 12/05/2016  Assessment & Plan:    1. History of elevated PSA  - underwent a biopsy in January 2015 for a PSA of 5.4 ng/mL - negative  - most recent PSA was 1.52 ng/mL on 12/05/2016  - RTC in 12 months for PSA and exam  2. Family history of prostate cancer  - father was diagnosed with non-lethal prostate cancer  3. BPH with LUTS  - IPSS score is 19/3, it is worsening  - Continue conservative management, avoiding bladder irritants and timed voiding's  - Continue finasteride 5 mg and tamsulosin 0.4 mg daily; refills given  - Also on Cardura 4, 1/2 daily by PCP  - RTC in 12 months for IPSS, PSA and exam   4. Erectile dysfunction:     - SHIM score is 5  - not sexually active at this time   Return in about 1 year (around 12/13/2017) for IPSS, PSA and exam.  Zara Council, [facility]  [place] [address] [place], [place] [address] [phone]

## 2016-12-13 ENCOUNTER — Encounter: Payer: Self-pay | Admitting: Urology

## 2016-12-13 ENCOUNTER — Ambulatory Visit (INDEPENDENT_AMBULATORY_CARE_PROVIDER_SITE_OTHER): Payer: PPO | Admitting: Urology

## 2016-12-13 ENCOUNTER — Telehealth: Payer: Self-pay | Admitting: Urology

## 2016-12-13 VITALS — BP 142/75 | HR 65 | Ht 69.0 in | Wt 184.5 lb

## 2016-12-13 DIAGNOSIS — Z87898 Personal history of other specified conditions: Secondary | ICD-10-CM

## 2016-12-13 DIAGNOSIS — N401 Enlarged prostate with lower urinary tract symptoms: Secondary | ICD-10-CM | POA: Diagnosis not present

## 2016-12-13 DIAGNOSIS — N529 Male erectile dysfunction, unspecified: Secondary | ICD-10-CM | POA: Diagnosis not present

## 2016-12-13 DIAGNOSIS — Z8042 Family history of malignant neoplasm of prostate: Secondary | ICD-10-CM | POA: Diagnosis not present

## 2016-12-13 DIAGNOSIS — R972 Elevated prostate specific antigen [PSA]: Secondary | ICD-10-CM

## 2016-12-13 DIAGNOSIS — N138 Other obstructive and reflux uropathy: Secondary | ICD-10-CM | POA: Diagnosis not present

## 2016-12-13 LAB — BLADDER SCAN AMB NON-IMAGING: Scan Result: 29

## 2016-12-13 MED ORDER — TAMSULOSIN HCL 0.4 MG PO CAPS: 0.4000 mg | ORAL_CAPSULE | Freq: Every day | ORAL | 3 refills | Status: DC

## 2016-12-13 MED ORDER — FINASTERIDE 5 MG PO TABS: 5.0000 mg | ORAL_TABLET | Freq: Every day | ORAL | 3 refills | Status: DC

## 2016-12-13 NOTE — Telephone Encounter (Signed)
Done. Future Order placed.

## 2016-12-13 NOTE — Telephone Encounter (Signed)
Please put orders in for the [facility] location for 1 yr PSA prior to appt. Thanks =)

## 2017-02-14 DIAGNOSIS — Z23 Encounter for immunization: Secondary | ICD-10-CM | POA: Diagnosis not present

## 2017-03-12 DIAGNOSIS — Z6827 Body mass index (BMI) 27.0-27.9, adult: Secondary | ICD-10-CM | POA: Diagnosis not present

## 2017-03-12 DIAGNOSIS — I10 Essential (primary) hypertension: Secondary | ICD-10-CM | POA: Diagnosis not present

## 2017-03-12 DIAGNOSIS — R7301 Impaired fasting glucose: Secondary | ICD-10-CM | POA: Diagnosis not present

## 2017-09-10 DIAGNOSIS — Z6826 Body mass index (BMI) 26.0-26.9, adult: Secondary | ICD-10-CM | POA: Diagnosis not present

## 2017-09-10 DIAGNOSIS — D485 Neoplasm of uncertain behavior of skin: Secondary | ICD-10-CM | POA: Diagnosis not present

## 2017-09-10 DIAGNOSIS — M48061 Spinal stenosis, lumbar region without neurogenic claudication: Secondary | ICD-10-CM | POA: Diagnosis not present

## 2017-09-10 DIAGNOSIS — I10 Essential (primary) hypertension: Secondary | ICD-10-CM | POA: Diagnosis not present

## 2017-11-29 ENCOUNTER — Encounter: Payer: Self-pay | Admitting: Urology

## 2017-12-03 ENCOUNTER — Other Ambulatory Visit
Admission: RE | Admit: 2017-12-03 | Discharge: 2017-12-03 | Disposition: A | Discharge status: Home / Self Care | Payer: PPO | Source: Ambulatory Visit | Attending: Urology | Admitting: Urology

## 2017-12-03 DIAGNOSIS — R972 Elevated prostate specific antigen [PSA]: Secondary | ICD-10-CM

## 2017-12-03 LAB — PSA: Prostatic Specific Antigen: 1.82 ng/mL (ref 0.00–4.00)

## 2017-12-11 NOTE — Progress Notes (Signed)
8:41 AM   Austin Ellis 04/26/1941 [id number]  Referring provider: Lynnell Jude, MD [address] [place], [place] [address]  Chief Complaint  Patient presents with  . Elevated PSA    1 year    HPI: Austin Ellis is a 77 year old Caucasian male with a history of elevated PSA, erectile dysfunction and BPH with LUTS who presents his 6 month follow-up.  History of elevated PSA PSA Trend  bx negative in 2009     5.4 ng/mL on 05/28/2013- neg bx     4.2 ng/mL on 12/07/2013     4.0 ng/mL on 06/09/2014 - started finasteride     1.9 (3.8) ng/mL on 06/06/2015     1.6 (3.2) ng/mL on 12/18/2015  2.1 (4.2) ng/mL on 06/12/2016  1.52 (3.04) ng/mL on 12/05/2016  1.82 (3.64) in 11/2017  BPH WITH LUTS  His IPSS score today is 13, which is moderate lower urinary tract symptomatology. He is mostly satisfied with his quality life due to his urinary symptoms.  His previous IPSS score was 19/3.  His previous PVR was 29 mL.  His major complaints today are urinary a weak urinary stream.   He denies any dysuria, hematuria or suprapubic pain.   He is currently taking tamsulosin 0.4 mg and finasteride 5 mg daily.  He is also taking doxazosin 2 mg daily through his PCP.  He also denies any recent fevers, chills, nausea or vomiting.  His father was diagnosed with prostate cancer, non-lethal.  Patient was to undergo a cystoscopic examination to evaluate for BOO, but this was not performed due to not wanting to proceed with outlet procedure. IPSS    Row Name 12/12/17 0800         International Prostate Symptom Score   How often have you had the sensation of not emptying your bladder?  Less than 1 in 5     How often have you had to urinate less than every two hours?  About half the time     How often have you found you stopped and started again several times when you urinated?  About half the time     How often have you found it difficult to postpone urination?  Less than 1 in 5 times     How often have  you had a weak urinary stream?  More than half the time     How often have you had to strain to start urination?  Not at All     How many times did you typically get up at night to urinate?  1 Time     Total IPSS Score  13       Quality of Life due to urinary symptoms   If you were to spend the rest of your life with your urinary condition just the way it is now how would you feel about that?  Mostly Satisfied        Score:  1-7 Mild 8-19 Moderate 20-35 Severe   PMH: Past Medical History:  Diagnosis Date  . Arthritis   . Benign enlargement of prostate   . Chronic back pain   . Elevated PSA    underwent TRUSBx on 05/28/2013 for a PSA of 5.4 ng/ml  . Erectile dysfunction   . Hypertension   . Urinary hesitancy     Surgical History: Past Surgical History:  Procedure Laterality Date  . TONSILLECTOMY      Home Medications:  Allergies as of 12/12/2017  Reactions   Cialis [tadalafil]    Back Pain      Medication List        Accurate as of 12/12/17  8:41 AM. Always use your most recent med list.          acetaminophen 325 MG tablet Commonly known as:  TYLENOL Take 650 mg by mouth every 6 (six) hours as needed.   doxazosin 4 MG tablet Commonly known as:  CARDURA 1 mg.   finasteride 5 MG tablet Commonly known as:  PROSCAR Take 1 tablet (5 mg total) by mouth daily.   ibuprofen 200 MG tablet Commonly known as:  ADVIL,MOTRIN Take 200 mg by mouth every 4 (four) hours as needed.   losartan-hydrochlorothiazide 100-12.5 MG tablet Commonly known as:  HYZAAR   tamsulosin 0.4 MG Caps capsule Commonly known as:  FLOMAX Take 1 capsule (0.4 mg total) by mouth daily.       Allergies:  Allergies  Allergen Reactions  . Cialis [Tadalafil]     Back Pain    Family History: Family History  Problem Relation Age of Onset  . Prostate cancer Father   . Lung cancer Father   . Kidney disease Neg Hx   . Bladder Cancer Neg Hx   . Kidney cancer Neg Hx     Social  History:  reports that he has quit smoking. He has never used smokeless tobacco. He reports that he does not drink alcohol or use drugs.  ROS: UROLOGY Frequent Urination?: No Hard to postpone urination?: No Burning/pain with urination?: No Get up at night to urinate?: No Leakage of urine?: No Urine stream starts and stops?: No Trouble starting stream?: No Do you have to strain to urinate?: No Blood in urine?: No Urinary tract infection?: No Sexually transmitted disease?: No Injury to kidneys or bladder?: No Painful intercourse?: No Weak stream?: Yes Erection problems?: Yes Penile pain?: No  Gastrointestinal Nausea?: No Vomiting?: No Indigestion/heartburn?: No Diarrhea?: No Constipation?: No  Constitutional Fever: No Night sweats?: No Weight loss?: No Fatigue?: No  Skin Skin rash/lesions?: No Itching?: No  Eyes Blurred vision?: No Double vision?: No  Ears/Nose/Throat Sore throat?: No Sinus problems?: No  Hematologic/Lymphatic Swollen glands?: No Easy bruising?: No  Cardiovascular Leg swelling?: No Chest pain?: No  Respiratory Cough?: No Shortness of breath?: No  Endocrine Excessive thirst?: No  Musculoskeletal Back pain?: Yes Joint pain?: Yes  Neurological Headaches?: No Dizziness?: No  Psychologic Depression?: No Anxiety?: No  Physical Exam: BP (!) 158/73   Pulse 66   Ht 5\' 9"  (1.753 m)   Wt 182 lb (82.6 kg)   BMI 26.88 kg/m   Constitutional: Well nourished. Alert and oriented, No acute distress. HEENT: [place] AT, moist mucus membranes. Trachea midline, no masses. Cardiovascular: No clubbing, cyanosis, or edema. Respiratory: Normal respiratory effort, no increased work of breathing. GI: Abdomen is soft, non tender, non distended, no abdominal masses. Liver and spleen not palpable.  No hernias appreciated.  Stool sample for occult testing is not indicated.   GU: No CVA tenderness.  No bladder fullness or masses.  Patient with  uncircumcised phallus.  Foreskin easily retracted  Urethral meatus is patent.  No penile discharge. No penile lesions or rashes. Scrotum without lesions, cysts, rashes and/or edema.  Testicles are located scrotally bilaterally. No masses are appreciated in the testicles. Left and right epididymis are normal. Rectal: Patient with  normal sphincter tone. Anus and perineum without scarring or rashes. No rectal masses are appreciated. Prostate is approximately  55 grams, no nodules are appreciated. Seminal vesicles are normal. Skin: No rashes, bruises or suspicious lesions. Lymph: No cervical or inguinal adenopathy. Neurologic: Grossly intact, no focal deficits, moving all 4 extremities. Psychiatric: Normal mood and affect.   Laboratory Data: See HPI I have reviewed the labs.  Assessment & Plan:    1. History of elevated PSA Current PSA is 1.82 (3.64) Explained that PSA should not increase while on finasteride and it can be concerning for prostate cancer.  At his age, it would be reasonable to shorten the monitoring interval to 6 months as prostate cancer would be approached in a palliative manner.  I discussed the PSA may slightly vary over time.  We would become more concerned if the increase becomes persistent.  He would like to continue with yearly visits and understands the risk of delayed diagnosis of prostate cancer  2. Family history of prostate cancer Father was diagnosed with non-lethal prostate cancer  3. BPH with LUTS IPSS score is 13/2, it is improving Continue conservative management, avoiding bladder irritants and timed voiding's Continue finasteride 5 mg and tamsulosin 0.4 mg daily; refills given Also on Cardura 4, 1/2 daily by PCP RTC in 12 months for IPSS, PSA and exam    Return in about 1 year (around 12/13/2018) for IPSS, PSA and exam.  Zara Council, [facility]  [place], [place] [address] [phone]

## 2017-12-12 ENCOUNTER — Ambulatory Visit (INDEPENDENT_AMBULATORY_CARE_PROVIDER_SITE_OTHER): Payer: PPO | Admitting: Urology

## 2017-12-12 ENCOUNTER — Encounter: Payer: Self-pay | Admitting: Urology

## 2017-12-12 VITALS — BP 158/73 | HR 66 | Ht 69.0 in | Wt 182.0 lb

## 2017-12-12 DIAGNOSIS — N138 Other obstructive and reflux uropathy: Secondary | ICD-10-CM

## 2017-12-12 DIAGNOSIS — N401 Enlarged prostate with lower urinary tract symptoms: Secondary | ICD-10-CM

## 2017-12-12 DIAGNOSIS — Z87898 Personal history of other specified conditions: Secondary | ICD-10-CM | POA: Diagnosis not present

## 2017-12-12 MED ORDER — TAMSULOSIN HCL 0.4 MG PO CAPS: 0.4000 mg | ORAL_CAPSULE | Freq: Every day | ORAL | 3 refills | Status: DC

## 2017-12-12 MED ORDER — FINASTERIDE 5 MG PO TABS: 5.0000 mg | ORAL_TABLET | Freq: Every day | ORAL | 3 refills | Status: DC

## 2018-02-24 DIAGNOSIS — Z6826 Body mass index (BMI) 26.0-26.9, adult: Secondary | ICD-10-CM | POA: Diagnosis not present

## 2018-02-24 DIAGNOSIS — Z0000 Encounter for general adult medical examination without abnormal findings: Secondary | ICD-10-CM | POA: Diagnosis not present

## 2018-02-24 DIAGNOSIS — Z7982 Long term (current) use of aspirin: Secondary | ICD-10-CM | POA: Diagnosis not present

## 2018-02-24 DIAGNOSIS — Z131 Encounter for screening for diabetes mellitus: Secondary | ICD-10-CM | POA: Diagnosis not present

## 2018-02-24 DIAGNOSIS — Z13220 Encounter for screening for lipoid disorders: Secondary | ICD-10-CM | POA: Diagnosis not present

## 2018-02-24 DIAGNOSIS — R7301 Impaired fasting glucose: Secondary | ICD-10-CM | POA: Diagnosis not present

## 2018-02-24 DIAGNOSIS — N400 Enlarged prostate without lower urinary tract symptoms: Secondary | ICD-10-CM | POA: Diagnosis not present

## 2018-02-24 DIAGNOSIS — Z79899 Other long term (current) drug therapy: Secondary | ICD-10-CM | POA: Diagnosis not present

## 2018-02-24 DIAGNOSIS — I10 Essential (primary) hypertension: Secondary | ICD-10-CM | POA: Diagnosis not present

## 2018-02-24 DIAGNOSIS — E782 Mixed hyperlipidemia: Secondary | ICD-10-CM | POA: Diagnosis not present

## 2018-03-10 DIAGNOSIS — R300 Dysuria: Secondary | ICD-10-CM | POA: Diagnosis not present

## 2018-03-10 DIAGNOSIS — Z6827 Body mass index (BMI) 27.0-27.9, adult: Secondary | ICD-10-CM | POA: Diagnosis not present

## 2018-09-16 DIAGNOSIS — M1611 Unilateral primary osteoarthritis, right hip: Secondary | ICD-10-CM | POA: Diagnosis not present

## 2018-09-16 DIAGNOSIS — Z7982 Long term (current) use of aspirin: Secondary | ICD-10-CM | POA: Diagnosis not present

## 2018-09-16 DIAGNOSIS — I10 Essential (primary) hypertension: Secondary | ICD-10-CM | POA: Diagnosis not present

## 2018-09-16 DIAGNOSIS — Z79899 Other long term (current) drug therapy: Secondary | ICD-10-CM | POA: Diagnosis not present

## 2018-09-16 DIAGNOSIS — D519 Vitamin B12 deficiency anemia, unspecified: Secondary | ICD-10-CM | POA: Diagnosis not present

## 2018-09-16 DIAGNOSIS — N419 Inflammatory disease of prostate, unspecified: Secondary | ICD-10-CM | POA: Diagnosis not present

## 2018-09-16 DIAGNOSIS — Z131 Encounter for screening for diabetes mellitus: Secondary | ICD-10-CM | POA: Diagnosis not present

## 2018-09-16 DIAGNOSIS — Z0001 Encounter for general adult medical examination with abnormal findings: Secondary | ICD-10-CM | POA: Diagnosis not present

## 2018-09-30 DIAGNOSIS — D519 Vitamin B12 deficiency anemia, unspecified: Secondary | ICD-10-CM | POA: Diagnosis not present

## 2018-10-07 DIAGNOSIS — D519 Vitamin B12 deficiency anemia, unspecified: Secondary | ICD-10-CM | POA: Diagnosis not present

## 2018-10-14 DIAGNOSIS — D519 Vitamin B12 deficiency anemia, unspecified: Secondary | ICD-10-CM | POA: Diagnosis not present

## 2018-10-21 DIAGNOSIS — D519 Vitamin B12 deficiency anemia, unspecified: Secondary | ICD-10-CM | POA: Diagnosis not present

## 2018-12-14 ENCOUNTER — Other Ambulatory Visit: Payer: Self-pay

## 2018-12-14 DIAGNOSIS — N138 Other obstructive and reflux uropathy: Secondary | ICD-10-CM

## 2018-12-14 DIAGNOSIS — Z87898 Personal history of other specified conditions: Secondary | ICD-10-CM

## 2018-12-14 DIAGNOSIS — N401 Enlarged prostate with lower urinary tract symptoms: Secondary | ICD-10-CM

## 2018-12-15 ENCOUNTER — Other Ambulatory Visit
Admission: RE | Admit: 2018-12-15 | Discharge: 2018-12-15 | Disposition: A | Discharge status: Home / Self Care | Payer: PPO | Attending: Urology | Admitting: Urology

## 2018-12-15 ENCOUNTER — Other Ambulatory Visit: Payer: Self-pay

## 2018-12-15 DIAGNOSIS — N401 Enlarged prostate with lower urinary tract symptoms: Secondary | ICD-10-CM | POA: Diagnosis not present

## 2018-12-15 DIAGNOSIS — N138 Other obstructive and reflux uropathy: Secondary | ICD-10-CM

## 2018-12-15 DIAGNOSIS — Z87898 Personal history of other specified conditions: Secondary | ICD-10-CM | POA: Insufficient documentation

## 2018-12-15 LAB — PSA: Prostatic Specific Antigen: 1.41 ng/mL (ref 0.00–4.00)

## 2018-12-15 NOTE — Addendum Note (Signed)
Addended by: Elmo Putt on: 12/15/2018 02:20 PM   Modules accepted: Orders

## 2018-12-17 ENCOUNTER — Other Ambulatory Visit: Payer: PPO

## 2018-12-24 ENCOUNTER — Ambulatory Visit: Payer: PPO | Admitting: Urology

## 2018-12-25 NOTE — Progress Notes (Signed)
10:19 AM   Austin Ellis 02/27/1941 [id number]  Referring provider: Lynnell Jude, MD [address] [place],  [place] [address]  Chief Complaint  Patient presents with  . Benign Prostatic Hypertrophy    HPI: Austin Ellis is a 78 year old male with a history of elevated PSA, erectile dysfunction and BPH with LUTS who presents his 6 month follow-up.  History of elevated PSA PSA Trend  bx negative in 2009     5.4 ng/mL on 05/28/2013- neg bx     4.2 ng/mL on 12/07/2013     4.0 ng/mL on 06/09/2014 - started finasteride     1.9 (3.8) ng/mL on 06/06/2015     1.6 (3.2) ng/mL on 12/18/2015  2.1 (4.2) ng/mL on 06/12/2016  1.52 (3.04) ng/mL on 12/05/2016  1.82 (3.64) in 11/2017  1.41 (2.82) in 11/2018  BPH WITH LUTS  His IPSS score today is 11, which is moderate lower urinary tract symptomatology. He is mostly satisfied  with his quality life due to his urinary symptoms.  His previous IPSS score was 13/2.   His previous PVR was 29 mL.  His major complaints today are urinary intermittency.  He denies any dysuria, hematuria or suprapubic pain.   He is currently taking tamsulosin 0.4 mg and finasteride 5 mg daily.  He is also taking doxazosin 2 mg daily through his PCP.  He also denies any recent fevers, chills, nausea or vomiting.  His father was diagnosed with prostate cancer, non-lethal.  Patient was to undergo a cystoscopic examination to evaluate for BOO, but this was not performed due to not wanting to proceed with outlet procedure. IPSS    Row Name 12/28/18 1000         International Prostate Symptom Score   How often have you had the sensation of not emptying your bladder?  Less than 1 in 5     How often have you had to urinate less than every two hours?  Less than half the time     How often have you found you stopped and started again several times when you urinated?  About half the time     How often have you found it difficult to postpone urination?  Not at All     How often  have you had a weak urinary stream?  More than half the time     How often have you had to strain to start urination?  Not at All     How many times did you typically get up at night to urinate?  1 Time     Total IPSS Score  11       Quality of Life due to urinary symptoms   If you were to spend the rest of your life with your urinary condition just the way it is now how would you feel about that?  Mostly Satisfied        Score:  1-7 Mild 8-19 Moderate 20-35 Severe   PMH: Past Medical History:  Diagnosis Date  . Arthritis   . Benign enlargement of prostate   . Chronic back pain   . Elevated PSA    underwent TRUSBx on 05/28/2013 for a PSA of 5.4 ng/ml  . Erectile dysfunction   . Hypertension   . Urinary hesitancy     Surgical History: Past Surgical History:  Procedure Laterality Date  . TONSILLECTOMY      Home Medications:  Allergies as of 12/28/2018  Reactions   Cialis [tadalafil]    Back Pain      Medication List       Accurate as of December 28, 2018 10:19 AM. If you have any questions, ask your nurse or doctor.        acetaminophen 325 MG tablet Commonly known as: TYLENOL Take 650 mg by mouth every 6 (six) hours as needed.   doxazosin 4 MG tablet Commonly known as: CARDURA 1 mg.   doxazosin 2 MG tablet Commonly known as: CARDURA   finasteride 5 MG tablet Commonly known as: PROSCAR Take 1 tablet (5 mg total) by mouth daily.   hydrochlorothiazide 12.5 MG tablet Commonly known as: HYDRODIURIL   ibuprofen 200 MG tablet Commonly known as: ADVIL Take 200 mg by mouth every 4 (four) hours as needed.   losartan 100 MG tablet Commonly known as: COZAAR   losartan-hydrochlorothiazide 100-12.5 MG tablet Commonly known as: HYZAAR   tamsulosin 0.4 MG Caps capsule Commonly known as: FLOMAX Take 1 capsule (0.4 mg total) by mouth daily.   VITAMIN B 12 PO Take 1,000 mg by mouth daily.       Allergies:  Allergies  Allergen Reactions  . Cialis  [Tadalafil]     Back Pain    Family History: Family History  Problem Relation Age of Onset  . Prostate cancer Father   . Lung cancer Father   . Kidney disease Neg Hx   . Bladder Cancer Neg Hx   . Kidney cancer Neg Hx     Social History:  reports that he has quit smoking. He has never used smokeless tobacco. He reports that he does not drink alcohol or use drugs.  ROS: UROLOGY Frequent Urination?: No Hard to postpone urination?: No Burning/pain with urination?: No Get up at night to urinate?: No Leakage of urine?: No Urine stream starts and stops?: Yes Trouble starting stream?: No Do you have to strain to urinate?: No Blood in urine?: No Urinary tract infection?: No Sexually transmitted disease?: No Injury to kidneys or bladder?: No Painful intercourse?: No Weak stream?: No Erection problems?: Yes Penile pain?: No  Gastrointestinal Nausea?: No Vomiting?: No Indigestion/heartburn?: No Diarrhea?: No Constipation?: No  Constitutional Fever: No Night sweats?: No Weight loss?: No Fatigue?: No  Skin Skin rash/lesions?: No Itching?: No  Eyes Blurred vision?: No Double vision?: No  Ears/Nose/Throat Sore throat?: No Sinus problems?: No  Hematologic/Lymphatic Swollen glands?: No Easy bruising?: No  Cardiovascular Leg swelling?: No Chest pain?: No  Respiratory Cough?: No Shortness of breath?: No  Endocrine Excessive thirst?: No  Musculoskeletal Back pain?: Yes Joint pain?: Yes  Neurological Headaches?: No Dizziness?: No  Psychologic Depression?: No Anxiety?: No  Physical Exam: BP 126/68 (BP Location: Left Arm, Patient Position: Sitting, Cuff Size: Normal)   Pulse 80   Ht 5\' 9"  (1.753 m)   Wt 189 lb (85.7 kg)   BMI 27.91 kg/m   Constitutional:  Well nourished. Alert and oriented, No acute distress. HEENT: [place] AT, moist mucus membranes.  Trachea midline, no masses. Cardiovascular: No clubbing, cyanosis, or edema. Respiratory: Normal  respiratory effort, no increased work of breathing. GI: Abdomen is soft, non tender, non distended, no abdominal masses. Liver and spleen not palpable.  No hernias appreciated.  Stool sample for occult testing is not indicated.   GU: No CVA tenderness.  No bladder fullness or masses.  Patient with uncircumcised phallus.   Foreskin easily retracted  Urethral meatus is patent.  No penile discharge. No penile lesions or  rashes. Scrotum without lesions, cysts, rashes and/or edema.  Testicles are located scrotally bilaterally. No masses are appreciated in the testicles. Left and right epididymis are normal. Rectal: Patient with  normal sphincter tone. Anus and perineum without scarring or rashes. No rectal masses are appreciated. Prostate is approximately 50 grams, could only palpate the apex and the midportion of the gland, no nodules are appreciated. Seminal vesicles could not be palpated.   Skin: No rashes, bruises or suspicious lesions. Lymph: No inguinal adenopathy. Neurologic: Grossly intact, no focal deficits, moving all 4 extremities. Psychiatric: Normal mood and affect.   Laboratory Data: See HPI I have reviewed the labs.  Assessment & Plan:    1. History of elevated PSA Current PSA is 1.41 (2.82) He would like to continue with yearly visits and understands the risk of delayed diagnosis of prostate cancer  2. Family history of prostate cancer Father was diagnosed with non-lethal prostate cancer  3. BPH with LUTS IPSS score is 11/2, it is improving Continue conservative management, avoiding bladder irritants and timed voiding's Continue finasteride 5 mg and tamsulosin 0.4 mg daily; refills given Also on Cardura 4, 1/2 daily by PCP RTC in 12 months for IPSS, PSA and exam    Return in about 1 year (around 12/28/2019) for IPSS, PSA and exam.  Zara Council, [facility]  [place], [place] [address] [phone]

## 2018-12-28 ENCOUNTER — Ambulatory Visit (INDEPENDENT_AMBULATORY_CARE_PROVIDER_SITE_OTHER): Payer: PPO | Admitting: Urology

## 2018-12-28 ENCOUNTER — Encounter: Payer: Self-pay | Admitting: Urology

## 2018-12-28 ENCOUNTER — Other Ambulatory Visit: Payer: Self-pay

## 2018-12-28 VITALS — BP 126/68 | HR 80 | Ht 69.0 in | Wt 189.0 lb

## 2018-12-28 DIAGNOSIS — Z8042 Family history of malignant neoplasm of prostate: Secondary | ICD-10-CM | POA: Diagnosis not present

## 2018-12-28 DIAGNOSIS — N138 Other obstructive and reflux uropathy: Secondary | ICD-10-CM

## 2018-12-28 DIAGNOSIS — N401 Enlarged prostate with lower urinary tract symptoms: Secondary | ICD-10-CM | POA: Diagnosis not present

## 2018-12-28 DIAGNOSIS — Z87898 Personal history of other specified conditions: Secondary | ICD-10-CM

## 2018-12-28 MED ORDER — TAMSULOSIN HCL 0.4 MG PO CAPS: 0.4000 mg | ORAL_CAPSULE | Freq: Every day | ORAL | 3 refills | Status: DC

## 2018-12-28 MED ORDER — FINASTERIDE 5 MG PO TABS: 5.0000 mg | ORAL_TABLET | Freq: Every day | ORAL | 3 refills | Status: DC

## 2019-03-18 DIAGNOSIS — I10 Essential (primary) hypertension: Secondary | ICD-10-CM | POA: Diagnosis not present

## 2019-03-18 DIAGNOSIS — M1611 Unilateral primary osteoarthritis, right hip: Secondary | ICD-10-CM | POA: Diagnosis not present

## 2019-03-18 DIAGNOSIS — E782 Mixed hyperlipidemia: Secondary | ICD-10-CM | POA: Diagnosis not present

## 2019-03-18 DIAGNOSIS — Z6827 Body mass index (BMI) 27.0-27.9, adult: Secondary | ICD-10-CM | POA: Diagnosis not present

## 2019-12-27 ENCOUNTER — Other Ambulatory Visit: Payer: Self-pay | Admitting: *Deleted

## 2019-12-27 ENCOUNTER — Other Ambulatory Visit
Admission: RE | Admit: 2019-12-27 | Discharge: 2019-12-27 | Disposition: A | Discharge status: Home / Self Care | Payer: PPO | Attending: Urology | Admitting: Urology

## 2019-12-27 DIAGNOSIS — Z87898 Personal history of other specified conditions: Secondary | ICD-10-CM | POA: Insufficient documentation

## 2019-12-27 LAB — PSA: Prostatic Specific Antigen: 1.61 ng/mL (ref 0.00–4.00)

## 2019-12-27 NOTE — Addendum Note (Signed)
Addended by: Despina Hidden on: 12/27/2019 10:19 AM   Modules accepted: Orders

## 2020-01-03 ENCOUNTER — Ambulatory Visit: Payer: PPO | Admitting: Urology

## 2020-01-10 ENCOUNTER — Ambulatory Visit: Payer: PPO | Admitting: Urology

## 2020-01-26 ENCOUNTER — Telehealth: Payer: Self-pay | Admitting: *Deleted

## 2020-01-26 NOTE — Telephone Encounter (Signed)
Notified patient as instructed, patient pleased. Discussed follow-up appointments, patient agrees  

## 2020-01-26 NOTE — Telephone Encounter (Signed)
-----   Message from Nori Riis, PA-C sent at 01/25/2020  8:19 PM EDT ----- Please have Austin Ellis schedule an appointment for exam and medication refill.

## 2020-02-04 ENCOUNTER — Other Ambulatory Visit: Payer: Self-pay | Admitting: Urology

## 2020-02-04 DIAGNOSIS — N401 Enlarged prostate with lower urinary tract symptoms: Secondary | ICD-10-CM

## 2020-02-06 NOTE — Progress Notes (Signed)
4:56 PM   Austin Ellis 04-17-1941 [id number]  Referring provider: Lynnell Jude, MD [address] [place],  [place] [address]  Chief Complaint  Patient presents with  . Follow-up    UA/IPSS    HPI: Austin Ellis is a 79 year old male with an elevated PSA, erectile dysfunction and BPH with LUTS who presents his 6 month follow-up.  History of elevated PSA PSA Trend  bx negative in 2009     5.4 ng/mL on 05/28/2013- neg bx     4.2 ng/mL on 12/07/2013     4.0 ng/mL on 06/09/2014 - started finasteride     1.9 (3.8) ng/mL on 06/06/2015     1.6 (3.2) ng/mL on 12/18/2015  2.1 (4.2) ng/mL on 06/12/2016  1.52 (3.04) ng/mL on 12/05/2016  1.82 (3.64) in 11/2017  1.41 (2.82) in 11/2018  1.61 (3.22) in 12/2019  BPH WITH LUTS  (prostate and/or bladder) IPSS score: 12/2    Previous score: 11/2  Previous PVR: 29 mL  Major complaint(s): Weak urinary stream x over one year.  Denies any dysuria, hematuria or suprapubic pain.   Currently taking: finasteride 5 mg daily, tamsulosin 0.4 mg daily   Denies any recent fevers, chills, nausea or vomiting.  His father was diagnosed with nonfatal prostate cancer   IPSS    Row Name 02/07/20 1500         International Prostate Symptom Score   How often have you had the sensation of not emptying your bladder? Less than 1 in 5     How often have you had to urinate less than every two hours? Less than half the time     How often have you found you stopped and started again several times when you urinated? About half the time     How often have you found it difficult to postpone urination? Less than 1 in 5 times     How often have you had a weak urinary stream? More than half the time     How often have you had to strain to start urination? Not at All     How many times did you typically get up at night to urinate? 1 Time     Total IPSS Score 12       Quality of Life due to urinary symptoms   If you were to spend the rest of your life with your  urinary condition just the way it is now how would you feel about that? Mostly Satisfied            Score:  1-7 Mild 8-19 Moderate 20-35 Severe   PMH: Past Medical History:  Diagnosis Date  . Arthritis   . Benign enlargement of prostate   . Chronic back pain   . Elevated PSA    underwent TRUSBx on 05/28/2013 for a PSA of 5.4 ng/ml  . Erectile dysfunction   . Hypertension   . Urinary hesitancy     Surgical History: Past Surgical History:  Procedure Laterality Date  . TONSILLECTOMY      Home Medications:  Allergies as of 02/07/2020      Reactions   Cialis [tadalafil]    Back Pain      Medication List       Accurate as of February 07, 2020 11:59 PM. If you have any questions, ask your nurse or doctor.        acetaminophen 325 MG tablet Commonly known as: TYLENOL Take 650  mg by mouth every 6 (six) hours as needed.   finasteride 5 MG tablet Commonly known as: PROSCAR TAKE ONE TABLET BY MOUTH DAILY.   hydrochlorothiazide 12.5 MG tablet Commonly known as: HYDRODIURIL   ibuprofen 200 MG tablet Commonly known as: ADVIL Take 200 mg by mouth every 4 (four) hours as needed.   losartan 100 MG tablet Commonly known as: COZAAR   losartan-hydrochlorothiazide 100-12.5 MG tablet Commonly known as: HYZAAR   tamsulosin 0.4 MG Caps capsule Commonly known as: FLOMAX TAKE 1 CAPSULE BY MOUTH DAILY.   VITAMIN B 12 PO Take 1,000 mg by mouth daily.       Allergies:  Allergies  Allergen Reactions  . Cialis [Tadalafil]     Back Pain    Family History: Family History  Problem Relation Age of Onset  . Prostate cancer Father   . Lung cancer Father   . Kidney disease Neg Hx   . Bladder Cancer Neg Hx   . Kidney cancer Neg Hx     Social History:  reports that he has quit smoking. He has never used smokeless tobacco. He reports that he does not drink alcohol and does not use drugs.  ROS: For pertinent review of systems please refer to history of present  illness  Physical Exam: BP (!) 173/88   Pulse 74   Ht 5\' 9"  (1.753 m)   Wt 187 lb (84.8 kg)   BMI 27.62 kg/m   Constitutional:  Well nourished. Alert and oriented, No acute distress. HEENT: [place] AT, mask in place.  Trachea midline Cardiovascular: No clubbing, cyanosis, or edema. Respiratory: Normal respiratory effort, no increased work of breathing. GI: Abdomen is soft, non tender, non distended, no abdominal masses. Liver and spleen not palpable.  Left inguinal hernia noted.  Stool sample for occult testing is not indicated.   GU: No CVA tenderness.  No bladder fullness or masses.  Patient with uncircumcised phallus.  Foreskin easily retracted  Urethral meatus is patent.  No penile discharge. No penile lesions or rashes. Scrotum without lesions, cysts, rashes and/or edema.  Testicles are located scrotally bilaterally. No masses are appreciated in the testicles. Left and right epididymis are normal. Rectal: Patient with  normal sphincter tone. Anus and perineum without scarring or rashes. No rectal masses are appreciated. Prostate is approximately 50 grams, no nodules are appreciated. Seminal vesicles could not be palpated.   Skin: No rashes, bruises or suspicious lesions. Lymph: No inguinal adenopathy. Neurologic: Grossly intact, no focal deficits, moving all 4 extremities. Psychiatric: Normal mood and affect.   Laboratory Data: Urinalysis Component     Latest Ref Rng & Units 02/07/2020  Color, Urine     YELLOW YELLOW  Appearance     CLEAR HAZY (A)  Specific Gravity, Urine     1.005 - 1.030 1.020  pH     5.0 - 8.0 5.5  Glucose, UA     NEGATIVE mg/dL NEGATIVE  Hgb urine dipstick     NEGATIVE TRACE (A)  Bilirubin Urine     NEGATIVE NEGATIVE  Ketones, ur     NEGATIVE mg/dL NEGATIVE  Protein     NEGATIVE mg/dL NEGATIVE  Nitrite     NEGATIVE POSITIVE (A)  Leukocytes,Ua     NEGATIVE MODERATE (A)  Squamous Epithelial / LPF     0 - 5 0-5  WBC, UA     0 - 5 WBC/hpf 11-20  RBC  / HPF     0 - 5 RBC/hpf 0-5  Bacteria, UA     NONE SEEN MANY (A)  WBC Clumps      PRESENT  I have reviewed the labs.  Assessment & Plan:    1. BPH with LUTS IPSS score is 12/2, it is slightly worse Continue conservative management, avoiding bladder irritants and timed voiding's Continue finasteride 5 mg and tamsulosin 0.4 mg daily; refills given RTC in 12 months for IPSS, PSA and exam   2. Family history of prostate cancer Father was diagnosed with non-lethal prostate cancer  3. History of elevated PSA PSA at baseline   Return in about 1 year (around 02/06/2021) for IPSS, PSA and exam.  Zara Council, [facility]  [place], [place] [address] [phone]

## 2020-02-07 ENCOUNTER — Other Ambulatory Visit: Payer: Self-pay

## 2020-02-07 ENCOUNTER — Encounter: Payer: Self-pay | Admitting: Urology

## 2020-02-07 ENCOUNTER — Ambulatory Visit: Payer: PPO | Admitting: Urology

## 2020-02-07 ENCOUNTER — Other Ambulatory Visit: Payer: Self-pay | Admitting: *Deleted

## 2020-02-07 ENCOUNTER — Other Ambulatory Visit
Admission: RE | Admit: 2020-02-07 | Discharge: 2020-02-07 | Disposition: A | Discharge status: Home / Self Care | Payer: PPO | Attending: Urology | Admitting: Urology

## 2020-02-07 VITALS — BP 173/88 | HR 74 | Ht 69.0 in | Wt 187.0 lb

## 2020-02-07 DIAGNOSIS — Z8042 Family history of malignant neoplasm of prostate: Secondary | ICD-10-CM

## 2020-02-07 DIAGNOSIS — N138 Other obstructive and reflux uropathy: Secondary | ICD-10-CM | POA: Diagnosis not present

## 2020-02-07 DIAGNOSIS — N401 Enlarged prostate with lower urinary tract symptoms: Secondary | ICD-10-CM | POA: Insufficient documentation

## 2020-02-07 DIAGNOSIS — R972 Elevated prostate specific antigen [PSA]: Secondary | ICD-10-CM | POA: Diagnosis not present

## 2020-02-07 LAB — URINALYSIS, COMPLETE (UACMP) WITH MICROSCOPIC
Bilirubin Urine: NEGATIVE
Glucose, UA: NEGATIVE mg/dL
Ketones, ur: NEGATIVE mg/dL
Nitrite: POSITIVE — AB
Protein, ur: NEGATIVE mg/dL
Specific Gravity, Urine: 1.02 (ref 1.005–1.030)
pH: 5.5 (ref 5.0–8.0)

## 2020-02-29 DIAGNOSIS — R7309 Other abnormal glucose: Secondary | ICD-10-CM | POA: Diagnosis not present

## 2020-02-29 DIAGNOSIS — Z6827 Body mass index (BMI) 27.0-27.9, adult: Secondary | ICD-10-CM | POA: Diagnosis not present

## 2020-02-29 DIAGNOSIS — Z23 Encounter for immunization: Secondary | ICD-10-CM | POA: Diagnosis not present

## 2020-02-29 DIAGNOSIS — Z791 Long term (current) use of non-steroidal anti-inflammatories (NSAID): Secondary | ICD-10-CM | POA: Diagnosis not present

## 2020-02-29 DIAGNOSIS — Z1159 Encounter for screening for other viral diseases: Secondary | ICD-10-CM | POA: Diagnosis not present

## 2020-02-29 DIAGNOSIS — M1612 Unilateral primary osteoarthritis, left hip: Secondary | ICD-10-CM | POA: Diagnosis not present

## 2020-02-29 DIAGNOSIS — Z79899 Other long term (current) drug therapy: Secondary | ICD-10-CM | POA: Diagnosis not present

## 2020-02-29 DIAGNOSIS — Z0001 Encounter for general adult medical examination with abnormal findings: Secondary | ICD-10-CM | POA: Diagnosis not present

## 2020-02-29 DIAGNOSIS — I10 Essential (primary) hypertension: Secondary | ICD-10-CM | POA: Diagnosis not present

## 2020-05-10 DIAGNOSIS — J069 Acute upper respiratory infection, unspecified: Secondary | ICD-10-CM | POA: Diagnosis not present

## 2020-05-10 DIAGNOSIS — R059 Cough, unspecified: Secondary | ICD-10-CM | POA: Diagnosis not present

## 2020-08-25 DIAGNOSIS — H2513 Age-related nuclear cataract, bilateral: Secondary | ICD-10-CM | POA: Diagnosis not present

## 2021-01-27 ENCOUNTER — Other Ambulatory Visit: Payer: Self-pay | Admitting: Urology

## 2021-01-27 DIAGNOSIS — N401 Enlarged prostate with lower urinary tract symptoms: Secondary | ICD-10-CM

## 2021-02-02 ENCOUNTER — Other Ambulatory Visit
Admission: RE | Admit: 2021-02-02 | Discharge: 2021-02-02 | Disposition: A | Discharge status: Home / Self Care | Payer: PPO | Attending: Urology | Admitting: Urology

## 2021-02-02 ENCOUNTER — Other Ambulatory Visit: Payer: Self-pay | Admitting: *Deleted

## 2021-02-02 DIAGNOSIS — R972 Elevated prostate specific antigen [PSA]: Secondary | ICD-10-CM | POA: Insufficient documentation

## 2021-02-02 LAB — PSA: Prostatic Specific Antigen: 1.21 ng/mL (ref 0.00–4.00)

## 2021-02-11 NOTE — Progress Notes (Signed)
2:35 PM   Austin Ellis 10-Aug-1940 [id number]  Referring provider: Lynnell Jude, MD [address] [place],  [place] [address]  Chief Complaint  Patient presents with   Follow-up    1 year follow-up    Urological history: 1. Elevated PSA -PSA Trend bx negative in 2009     5.4 ng/mL on 05/28/2013- neg bx     4.2 ng/mL on 12/07/2013     4.0 ng/mL on 06/09/2014 - started finasteride     1.9 (3.8) ng/mL on 06/06/2015     1.6 (3.2) ng/mL on 12/18/2015  2.1 (4.2) ng/mL on 06/12/2016  1.52 (3.04) ng/mL on 12/05/2016  1.82 (3.64) in 11/2017  1.41 (2.82) in 11/2018  1.61 (3.22) in 12/2019 Component     Latest Ref Rng & Units 12/05/2016 12/03/2017 12/15/2018 12/27/2019  Prostatic Specific Antigen     0.00 - 4.00 ng/mL 1.52 1.82 1.41 1.61   Component     Latest Ref Rng & Units 02/02/2021  Prostatic Specific Antigen     0.00 - 4.00 ng/mL 1.21   2. BPH with LU TS -I PSS 10/3 -managed with finasteride 5 mg daily and tamsulosin 0.4 mg twice daily  3. Family history of prostate cancer -father with nonfatal prostate cancer  4. ED -contributing factors of age, HTN, BPH and history of smoking -not sexually active at this time    HPI: Austin Ellis is a 80 y.o. male who presents his 6 month follow-up.  He is taking the tamsulosin 0.4 mg twice daily and finasteride 5 mg daily.  Patient denies any modifying or aggravating factors.  Patient denies any gross hematuria or suprapubic/flank pain.  Patient denies any fevers, chills, nausea or vomiting.    He is having an episode of dysuria that occurs in the morning that occurs at the initiation of the stream.    UA nitrite positive, 11-20 WBC's and many bacteria.    IPSS     Row Name 02/12/21 1400         International Prostate Symptom Score   How often have you had the sensation of not emptying your bladder? Less than 1 in 5     How often have you had to urinate less than every two hours? Less than 1 in 5 times     How  often have you found you stopped and started again several times when you urinated? Less than half the time     How often have you found it difficult to postpone urination? Less than 1 in 5 times     How often have you had a weak urinary stream? More than half the time     How often have you had to strain to start urination? Not at All     How many times did you typically get up at night to urinate? 1 Time     Total IPSS Score 10           Quality of Life due to urinary symptoms   If you were to spend the rest of your life with your urinary condition just the way it is now how would you feel about that? Mixed               Score:  1-7 Mild 8-19 Moderate 20-35 Severe   PMH: Past Medical History:  Diagnosis Date   Arthritis    Benign enlargement of prostate    Chronic back pain    Elevated PSA  underwent TRUSBx on 05/28/2013 for a PSA of 5.4 ng/ml   Erectile dysfunction    Hypertension    Urinary hesitancy     Surgical History: Past Surgical History:  Procedure Laterality Date   TONSILLECTOMY      Home Medications:  Allergies as of 02/12/2021       Reactions   Cialis [tadalafil]    Back Pain        Medication List        Accurate as of February 12, 2021  2:35 PM. If you have any questions, ask your nurse or doctor.          STOP taking these medications    losartan-hydrochlorothiazide 100-12.5 MG tablet Commonly known as: HYZAAR Stopped by: Zara Council, PA-C       TAKE these medications    acetaminophen 325 MG tablet Commonly known as: TYLENOL Take 650 mg by mouth every 6 (six) hours as needed.   finasteride 5 MG tablet Commonly known as: PROSCAR TAKE ONE TABLET BY MOUTH DAILY.   hydrochlorothiazide 12.5 MG tablet Commonly known as: HYDRODIURIL   ibuprofen 200 MG tablet Commonly known as: ADVIL Take 200 mg by mouth every 4 (four) hours as needed.   losartan 100 MG tablet Commonly known as: COZAAR   tamsulosin 0.4 MG Caps  capsule Commonly known as: FLOMAX TAKE 1 CAPSULE BY MOUTH DAILY.   VITAMIN B 12 PO Take 1,000 mg by mouth daily.        Allergies:  Allergies  Allergen Reactions   Cialis [Tadalafil]     Back Pain    Family History: Family History  Problem Relation Age of Onset   Prostate cancer Father    Lung cancer Father    Kidney disease Neg Hx    Bladder Cancer Neg Hx    Kidney cancer Neg Hx     Social History:  reports that he has quit smoking. He has never used smokeless tobacco. He reports that he does not drink alcohol and does not use drugs.  ROS: For pertinent review of systems please refer to history of present illness  Physical Exam: BP (!) 178/83   Pulse 87   Ht 5\' 9"  (1.753 m)   Wt 184 lb (83.5 kg)   BMI 27.17 kg/m   Constitutional:  Well nourished. Alert and oriented, No acute distress. HEENT: [place] AT, mask in place.  Trachea midline Cardiovascular: No clubbing, cyanosis, or edema. Respiratory: Normal respiratory effort, no increased work of breathing. GU: No CVA tenderness.  No bladder fullness or masses.  Patient with uncircumcised phallus.  Foreskin easily retracted  Urethral meatus is patent.  No penile discharge. No penile lesions or rashes. Scrotum without lesions, cysts, rashes and/or edema.  Testicles are located scrotally bilaterally. No masses are appreciated in the testicles. Left and right epididymis are normal. Rectal: Patient with  normal sphincter tone. Anus and perineum without scarring or rashes. No rectal masses are appreciated. Prostate is approximately 60 + grams, no nodules are appreciated. Seminal vesicles could not be palpated Neurologic: Grossly intact, no focal deficits, moving all 4 extremities. Psychiatric: Normal mood and affect.   Laboratory Data: Component     Latest Ref Rng & Units 02/12/2021  Color, Urine     YELLOW YELLOW  Appearance     CLEAR CLEAR  Specific Gravity, Urine     1.005 - 1.030 1.010  pH     5.0 - 8.0 5.0  Glucose,  UA     NEGATIVE  mg/dL NEGATIVE  Hgb urine dipstick     NEGATIVE TRACE (A)  Bilirubin Urine     NEGATIVE NEGATIVE  Ketones, ur     NEGATIVE mg/dL NEGATIVE  Protein     NEGATIVE mg/dL NEGATIVE  Nitrite     NEGATIVE POSITIVE (A)  Leukocytes,Ua     NEGATIVE MODERATE (A)  Squamous Epithelial / LPF     0 - 5 0-5  WBC, UA     0 - 5 WBC/hpf 11-20  RBC / HPF     0 - 5 RBC/hpf 0-5  Bacteria, UA     NONE SEEN MANY (A)   I have reviewed the labs.  Pertinent Imaging N/A  Assessment & Plan:    1. BPH with LUTS -PSA stable -DRE benign -UA grossly infected -continue tamsulosin 0.4 mg twice daily and finasteride 5 mg daily  2. Dysuria -UA grossly infected -urine sent for culture -Cipro 250 mg, BID x 7 days - per patient's request  -will adjust once culture are available   3. Family history of prostate cancer Father was diagnosed with non-lethal prostate cancer  4. History of elevated PSA PSA at baseline  Return in about 1 year (around 02/12/2022) for IPSS, PSA and exam.  Zara Council, [facility]  [place],  [address] [phone]

## 2021-02-12 ENCOUNTER — Encounter: Payer: Self-pay | Admitting: Urology

## 2021-02-12 ENCOUNTER — Ambulatory Visit: Payer: PPO | Admitting: Urology

## 2021-02-12 ENCOUNTER — Other Ambulatory Visit: Payer: Self-pay

## 2021-02-12 ENCOUNTER — Other Ambulatory Visit
Admission: RE | Admit: 2021-02-12 | Discharge: 2021-02-12 | Disposition: A | Discharge status: Home / Self Care | Payer: PPO | Attending: Urology | Admitting: Urology

## 2021-02-12 VITALS — BP 178/83 | HR 87 | Ht 69.0 in | Wt 184.0 lb

## 2021-02-12 DIAGNOSIS — N401 Enlarged prostate with lower urinary tract symptoms: Secondary | ICD-10-CM | POA: Diagnosis not present

## 2021-02-12 DIAGNOSIS — R300 Dysuria: Secondary | ICD-10-CM | POA: Diagnosis not present

## 2021-02-12 DIAGNOSIS — N138 Other obstructive and reflux uropathy: Secondary | ICD-10-CM | POA: Insufficient documentation

## 2021-02-12 LAB — URINALYSIS, COMPLETE (UACMP) WITH MICROSCOPIC
Bilirubin Urine: NEGATIVE
Glucose, UA: NEGATIVE mg/dL
Ketones, ur: NEGATIVE mg/dL
Nitrite: POSITIVE — AB
Protein, ur: NEGATIVE mg/dL
Specific Gravity, Urine: 1.01 (ref 1.005–1.030)
pH: 5 (ref 5.0–8.0)

## 2021-02-12 MED ORDER — CIPROFLOXACIN HCL 250 MG PO TABS: 250.0000 mg | ORAL_TABLET | Freq: Two times a day (BID) | ORAL | 0 refills | Status: DC

## 2021-02-15 LAB — URINE CULTURE: Culture: >=100000 — AB

## 2021-02-28 DIAGNOSIS — Z6827 Body mass index (BMI) 27.0-27.9, adult: Secondary | ICD-10-CM | POA: Diagnosis not present

## 2021-02-28 DIAGNOSIS — M1612 Unilateral primary osteoarthritis, left hip: Secondary | ICD-10-CM | POA: Diagnosis not present

## 2021-02-28 DIAGNOSIS — I10 Essential (primary) hypertension: Secondary | ICD-10-CM | POA: Diagnosis not present

## 2021-02-28 DIAGNOSIS — Z0001 Encounter for general adult medical examination with abnormal findings: Secondary | ICD-10-CM | POA: Diagnosis not present

## 2021-02-28 DIAGNOSIS — Z23 Encounter for immunization: Secondary | ICD-10-CM | POA: Diagnosis not present

## 2021-02-28 DIAGNOSIS — N401 Enlarged prostate with lower urinary tract symptoms: Secondary | ICD-10-CM | POA: Diagnosis not present

## 2021-02-28 DIAGNOSIS — Z1331 Encounter for screening for depression: Secondary | ICD-10-CM | POA: Diagnosis not present

## 2021-02-28 DIAGNOSIS — F17211 Nicotine dependence, cigarettes, in remission: Secondary | ICD-10-CM | POA: Diagnosis not present

## 2021-02-28 DIAGNOSIS — Z1389 Encounter for screening for other disorder: Secondary | ICD-10-CM | POA: Diagnosis not present

## 2021-02-28 DIAGNOSIS — Z791 Long term (current) use of non-steroidal anti-inflammatories (NSAID): Secondary | ICD-10-CM | POA: Diagnosis not present

## 2021-04-25 ENCOUNTER — Other Ambulatory Visit: Payer: Self-pay | Admitting: Urology

## 2021-10-01 NOTE — Progress Notes (Signed)
? ? ?9:38 AM  ? ?Austin Ellis ?Mar 07, 1941 ?[id number] ? ?Referring provider: Lynnell Jude, MD ?[place] ?[place],  [place] [address] ? ?Chief Complaint  ?Patient presents with  ? Dysuria  ? ?Urological history: ?1. Elevated PSA ?-PSA Trend ?bx negative in 2009 ?    5.4 ng/mL on 05/28/2013- neg bx ?    4.2 ng/mL on 12/07/2013 ?    4.0 ng/mL on 06/09/2014 - started finasteride ?    1.9 (3.8) ng/mL on 06/06/2015 ?    1.6 (3.2) ng/mL on 12/18/2015 ? 2.1 (4.2) ng/mL on 06/12/2016 ? 1.52 (3.04) ng/mL on 12/05/2016 ? 1.82 (3.64) in 11/2017 ? 1.41 (2.82) in 11/2018 ? 1.61 (3.22) in 12/2019 ?Component ?    Latest Ref Rng & Units 12/05/2016 12/03/2017 12/15/2018 12/27/2019  ?Prostatic Specific Antigen ?    0.00 - 4.00 ng/mL 1.52 1.82 1.41 1.61  ? ?Component ?    Latest Ref Rng & Units 02/02/2021  ?Prostatic Specific Antigen ?    0.00 - 4.00 ng/mL 1.21  ? ?2. BPH with LU TS ?-I PSS 20/3 ?-managed with finasteride 5 mg daily and tamsulosin 0.4 mg twice daily ? ?3. Family history of prostate cancer ?-father with nonfatal prostate cancer ? ?4. ED ?-contributing factors of age, HTN, BPH and history of smoking ?-not sexually active at this time  ? ? ?HPI: ?Austin Ellis is a 81 y.o. male who presents his 6 month follow-up. ? ?He is complaining of frequency, urgency, dysuria, difficulty urinating, a weak/split urinary stream and back/flank pain.  These symptoms have been happening for one week.   ? ?He is taking finasteride and tamsulosin.  ? ?Patient denies any modifying or aggravating factors.  Patient denies any gross hematuria, dysuria or suprapubic/flank pain.  Patient denies any fevers, chills, nausea or vomiting.   ? ?UA positive nitrites, > 30 WBC's and many bacteria.   ? ? IPSS   ? ? [place] Name 10/02/21 0800  ?  ?  ?  ? International Prostate Symptom Score  ? How often have you had the sensation of not emptying your bladder? More than half the time    ? How often have you had to urinate less than every two hours? More than  half the time    ? How often have you found you stopped and started again several times when you urinated? About half the time    ? How often have you found it difficult to postpone urination? About half the time    ? How often have you had a weak urinary stream? More than half the time    ? How often have you had to strain to start urination? Not at All    ? How many times did you typically get up at night to urinate? 2 Times    ? Total IPSS Score 20    ?  ? Quality of Life due to urinary symptoms  ? If you were to spend the rest of your life with your urinary condition just the way it is now how would you feel about that? Mixed    ? ?  ?  ? ?  ? ? ? ? ?Score:  ?1-7 Mild ?8-19 Moderate ?20-35 Severe ? ? ?PMH: ?Past Medical History:  ?Diagnosis Date  ? Arthritis   ? Benign enlargement of prostate   ? Chronic back pain   ? Elevated PSA   ? underwent TRUSBx on 05/28/2013 for a PSA of 5.4 ng/ml  ?  Erectile dysfunction   ? Hypertension   ? Urinary hesitancy   ? ? ?Surgical History: ?Past Surgical History:  ?Procedure Laterality Date  ? TONSILLECTOMY    ? ? ?Home Medications:  ?Allergies as of 10/02/2021   ? ?   Reactions  ? Cialis [tadalafil]   ? Back Pain  ? ?  ? ?  ?Medication List  ?  ? ?  ? Accurate as of Oct 02, 2021  9:38 AM. If you have any questions, ask your nurse or doctor.  ?  ?  ? ?  ? ?STOP taking these medications   ? ?ciprofloxacin 250 MG tablet ?Commonly known as: Cipro ?Stopped by: Zara Council, PA-C ?  ? ?  ? ?TAKE these medications   ? ?acetaminophen 325 MG tablet ?Commonly known as: TYLENOL ?Take 650 mg by mouth every 6 (six) hours as needed. ?  ?doxazosin 2 MG tablet ?Commonly known as: CARDURA ?Take 2 mg by mouth daily. ?  ?finasteride 5 MG tablet ?Commonly known as: PROSCAR ?TAKE ONE TABLET BY MOUTH DAILY. ?  ?hydrochlorothiazide 12.5 MG tablet ?Commonly known as: HYDRODIURIL ?  ?ibuprofen 200 MG tablet ?Commonly known as: ADVIL ?Take 200 mg by mouth every 4 (four) hours as needed. ?  ?Ibuprofen  200 MG Caps ?  ?losartan 100 MG tablet ?Commonly known as: COZAAR ?  ?sulfamethoxazole-trimethoprim 800-160 MG tablet ?Commonly known as: BACTRIM DS ?Take 1 tablet by mouth every 12 (twelve) hours. ?Started by: Zara Council, PA-C ?  ?tamsulosin 0.4 MG Caps capsule ?Commonly known as: FLOMAX ?TAKE (2) CAPSULES BY MOUTH EVERY DAY ?  ?VITAMIN B 12 PO ?Take 1,000 mg by mouth daily. ?  ? ?  ? ? ?Allergies:  ?Allergies  ?Allergen Reactions  ? Cialis [Tadalafil]   ?  Back Pain  ? ? ?Family History: ?Family History  ?Problem Relation Age of Onset  ? Prostate cancer Father   ? Lung cancer Father   ? Kidney disease Neg Hx   ? Bladder Cancer Neg Hx   ? Kidney cancer Neg Hx   ? ? ?Social History:  reports that he has quit smoking. He has never used smokeless tobacco. He reports that he does not drink alcohol and does not use drugs. ? ?ROS: ?For pertinent review of systems please refer to history of present illness ? ?Physical Exam: ?BP (!) 156/82 (BP Location: Left Arm, Patient Position: Sitting, Cuff Size: Normal)   Pulse 90   Ht '5\' 9"'$  (1.753 m)   Wt 184 lb (83.5 kg)   BMI 27.17 kg/m?   ?Constitutional:  Well nourished. Alert and oriented, No acute distress. ?HEENT: [place] AT, moist mucus membranes.  Trachea midline ?Cardiovascular: No clubbing, cyanosis, or edema. ?Respiratory: Normal respiratory effort, no increased work of breathing. ?GU: No CVA tenderness.  No bladder fullness or masses.  Patient with uncircumcised phallus. Foreskin easily retracted  Urethral meatus is patent.  No penile discharge. No penile lesions or rashes. Scrotum without lesions, cysts, rashes and/or edema.  Testicles are located scrotally bilaterally. No masses are appreciated in the testicles. Left and right epididymis are normal.  Bilateral hydrocele.  ?Rectal: Patient with  normal sphincter tone. Anus and perineum without scarring or rashes. No rectal masses are appreciated. Prostate is approximately 50 grams, no nodules are appreciated.  Seminal vesicles could not be palpated  ?Neurologic: Grossly intact, no focal deficits, moving all 4 extremities. ?Psychiatric: Normal mood and affect.  ? ?Laboratory Data: ?Results for orders placed or performed in visit on  10/02/21  ?Microscopic Examination  ? Urine  ?Result Value Ref Range  ? WBC, UA >30 (H) 0 - 5 /hpf  ? RBC 0-2 0 - 2 /hpf  ? Epithelial Cells (non renal) 0-10 0 - 10 /hpf  ? Bacteria, UA Many (A) None seen/Few  ?Urinalysis, Complete  ?Result Value Ref Range  ? Specific Gravity, UA 1.015 1.005 - 1.030  ? pH, UA 5.5 5.0 - 7.5  ? Color, UA Yellow Yellow  ? Appearance Ur Cloudy (A) Clear  ? Leukocytes,UA 3+ (A) Negative  ? Protein,UA Negative Negative/Trace  ? Glucose, UA Negative Negative  ? Ketones, UA Negative Negative  ? RBC, UA Trace (A) Negative  ? Bilirubin, UA Negative Negative  ? Urobilinogen, Ur 0.2 0.2 - 1.0 mg/dL  ? Nitrite, UA Positive (A) Negative  ? Microscopic Examination See below:   ?  ?I have reviewed the labs. ? ?Pertinent Imaging ?N/A ? ?Assessment & Plan:   ? ?1. Dysuria ?-UA grossly infected ?-urine culture ?-Septra DS, twice daily sent to pharmacy  ? ?2. BPH with LUTS ?-PSA pending  ?-DRE benign ?-continue tamsulosin 0.4 mg twice daily and finasteride 5 mg daily ? ?3. Family history of prostate cancer ?Father was diagnosed with non-lethal prostate cancer ? ? ?Return in about 1 year (around 10/03/2022) for follow up w/ PSA, IPSS. ? ?Toula Miyasaki, PA-C ? ?[place] ?[place], [place] [address] ?[phone] ? ?

## 2021-10-02 ENCOUNTER — Ambulatory Visit: Payer: PPO | Admitting: Urology

## 2021-10-02 ENCOUNTER — Encounter: Payer: Self-pay | Admitting: Urology

## 2021-10-02 VITALS — BP 156/82 | HR 90 | Ht 69.0 in | Wt 184.0 lb

## 2021-10-02 DIAGNOSIS — Z8042 Family history of malignant neoplasm of prostate: Secondary | ICD-10-CM

## 2021-10-02 DIAGNOSIS — N138 Other obstructive and reflux uropathy: Secondary | ICD-10-CM | POA: Diagnosis not present

## 2021-10-02 DIAGNOSIS — N401 Enlarged prostate with lower urinary tract symptoms: Secondary | ICD-10-CM | POA: Diagnosis not present

## 2021-10-02 DIAGNOSIS — R300 Dysuria: Secondary | ICD-10-CM | POA: Diagnosis not present

## 2021-10-02 LAB — URINALYSIS, COMPLETE
Bilirubin, UA: NEGATIVE
Glucose, UA: NEGATIVE
Ketones, UA: NEGATIVE
Nitrite, UA: POSITIVE — AB
Protein,UA: NEGATIVE
Specific Gravity, UA: 1.015 (ref 1.005–1.030)
Urobilinogen, Ur: 0.2 mg/dL (ref 0.2–1.0)
pH, UA: 5.5 (ref 5.0–7.5)

## 2021-10-02 LAB — MICROSCOPIC EXAMINATION: WBC, UA: >30 /hpf — ABNORMAL HIGH (ref 0–5)

## 2021-10-02 MED ORDER — SULFAMETHOXAZOLE-TRIMETHOPRIM 800-160 MG PO TABS: 1.0000 | ORAL_TABLET | Freq: Two times a day (BID) | ORAL | 0 refills | Status: DC

## 2021-10-03 LAB — PSA: Prostate Specific Ag, Serum: 7.2 ng/mL — ABNORMAL HIGH (ref 0.0–4.0)

## 2021-10-05 ENCOUNTER — Telehealth: Payer: Self-pay

## 2021-10-05 LAB — CULTURE, URINE COMPREHENSIVE

## 2021-10-05 NOTE — Telephone Encounter (Signed)
-----   Message from Nori Riis, PA-C sent at 10/05/2021  8:43 AM EDT ----- Please let Austin Ellis know that his urine culture was positive for infection and we need to see him in one month to recheck his PSA and make sure his symptoms have improved after taking the antibiotic.

## 2021-10-05 NOTE — Telephone Encounter (Signed)
Notified pt as advised, pt scheduled for 1 mo f/u in [place]. Pt confirmed and expressed understanding.

## 2021-11-02 ENCOUNTER — Other Ambulatory Visit: Payer: Self-pay

## 2021-11-02 ENCOUNTER — Other Ambulatory Visit
Admission: RE | Admit: 2021-11-02 | Discharge: 2021-11-02 | Disposition: A | Discharge status: Home / Self Care | Payer: PPO | Attending: Urology | Admitting: Urology

## 2021-11-02 DIAGNOSIS — R972 Elevated prostate specific antigen [PSA]: Secondary | ICD-10-CM

## 2021-11-02 LAB — PSA: Prostatic Specific Antigen: 1.61 ng/mL (ref 0.00–4.00)

## 2021-11-05 ENCOUNTER — Ambulatory Visit: Payer: PPO | Admitting: Urology

## 2021-11-05 ENCOUNTER — Encounter: Payer: Self-pay | Admitting: Urology

## 2021-11-05 VITALS — BP 160/79 | HR 69 | Ht 69.0 in | Wt 185.0 lb

## 2021-11-05 DIAGNOSIS — N401 Enlarged prostate with lower urinary tract symptoms: Secondary | ICD-10-CM | POA: Diagnosis not present

## 2021-11-05 DIAGNOSIS — R972 Elevated prostate specific antigen [PSA]: Secondary | ICD-10-CM | POA: Diagnosis not present

## 2021-11-05 DIAGNOSIS — Z87440 Personal history of urinary (tract) infections: Secondary | ICD-10-CM

## 2021-11-05 DIAGNOSIS — N390 Urinary tract infection, site not specified: Secondary | ICD-10-CM

## 2021-11-05 DIAGNOSIS — N138 Other obstructive and reflux uropathy: Secondary | ICD-10-CM

## 2021-11-05 NOTE — Progress Notes (Signed)
11/05/21 10:12 AM   Austin Ellis 04-17-1941 [id number]  Referring provider:  Lynnell Jude, MD [address] [place],  [place] [address]  Chief Complaint  Patient presents with   Elevated PSA    Urological history  Elevated PSA   Prostatic Specific Antigen  Latest Ref Rng 0.00 - 4.00 ng/mL  05/28/2013 5.4 (H) - neg biopsy   12/07/2013 4.2 (H)   06/09/2014 4.0 (H) - started on finasteride   06/06/2015 1.9  12/18/2015 1.6  06/12/2016 2.1  12/05/2016 1.52   12/03/2017 1.82   12/15/2018 1.41   12/27/2019 1.61   02/02/2021 1.21   11/02/2021 1.61    2. BPH with LUTS  - Managed with finasteride 5 mg daily and tamsulosin 0.4 mg daily   3. ED  -contributing factors of age, HTN, BPH and history of smoking  4. Family history of prostate cancer  -contributing factors of age, HTN, BPH and history of smoking  HPI: Austin Ellis is a 81 y.o.male who presents today for a 1 month symptom recheck and PSA.    He reports that his UTI symptoms have abated and he states currently his urinary symptoms are at baseline. He is experiencing post-void dribbling on occasion.   Patient denies any modifying or aggravating factors.  Patient denies any gross hematuria, dysuria or suprapubic/flank pain.  Patient denies any fevers, chills, nausea or vomiting.    PMH: Past Medical History:  Diagnosis Date   Arthritis    Benign enlargement of prostate    Chronic back pain    Elevated PSA    underwent TRUSBx on 05/28/2013 for a PSA of 5.4 ng/ml   Erectile dysfunction    Hypertension    Urinary hesitancy     Surgical History: Past Surgical History:  Procedure Laterality Date   TONSILLECTOMY      Home Medications:  Allergies as of 11/05/2021       Reactions   Cialis [tadalafil]    Back Pain        Medication List        Accurate as of November 05, 2021 10:12 AM. If you have any questions, ask your nurse or doctor.          STOP taking these medications     sulfamethoxazole-trimethoprim 800-160 MG tablet Commonly known as: BACTRIM DS Stopped by: Austin Ellis       TAKE these medications    acetaminophen 325 MG tablet Commonly known as: TYLENOL Take 650 mg by mouth every 6 (six) hours as needed.   doxazosin 2 MG tablet Commonly known as: CARDURA Take 2 mg by mouth daily.   finasteride 5 MG tablet Commonly known as: PROSCAR TAKE ONE TABLET BY MOUTH DAILY.   hydrochlorothiazide 12.5 MG tablet Commonly known as: HYDRODIURIL   ibuprofen 200 MG tablet Commonly known as: ADVIL Take 200 mg by mouth every 4 (four) hours as needed.   Ibuprofen 200 MG Caps   losartan 100 MG tablet Commonly known as: COZAAR   tamsulosin 0.4 MG Caps capsule Commonly known as: FLOMAX TAKE (2) CAPSULES BY MOUTH EVERY DAY   VITAMIN B 12 PO Take 1,000 mg by mouth daily.        Allergies:  Allergies  Allergen Reactions   Cialis [Tadalafil]     Back Pain    Family History: Family History  Problem Relation Age of Onset   Prostate cancer Father    Lung cancer Father    Kidney disease Neg Hx  Bladder Cancer Neg Hx    Kidney cancer Neg Hx     Social History:  reports that he has quit smoking. He has never used smokeless tobacco. He reports that he does not drink alcohol and does not use drugs.   Physical Exam: BP (!) 160/79   Pulse 69   Ht '5\' 9"'$  (1.753 m)   Wt 185 lb (83.9 kg)   BMI 27.32 kg/m   Constitutional:  Alert and oriented, No acute distress. HEENT: [place] AT, moist mucus membranes.  Trachea midline Cardiovascular: No clubbing, cyanosis, or edema. Respiratory: Normal respiratory effort, no increased work of breathing. Neurologic: Grossly intact, no focal deficits, moving all 4 extremities. Psychiatric: Normal mood and affect.   Laboratory Data: Component     Latest Ref Rng 11/02/2021  Prostatic Specific Antigen     0.00 - 4.00 ng/mL 1.61   I have reviewed the labs.   Pertinent  Imaging N/A    Assessment & Plan:   rUTIs - patient did have positive UTI with E.coli  in September and in May  - There is a  possibility of incomplete bladder emptying causing his rUTIs discussed further work-up for possible bladder outlet obstruction - he declined  2. Elevated PSA  - returned to baseline   3. BPH Patient went back down to Tamsulosin 0.4 mg and finasteride daily He had no improvement taking tamsulosin x2.    Return for keep follow up in May 2024.  [facility] [address], [place],  [address] [phone]  Austin Ellis as a scribe for [facility], Ellis.,have documented all relevant documentation on the behalf of Austin Ellis, Ellis,as directed by  [facility], Ellis while in the presence of Austin Adamcik, Ellis.

## 2022-01-14 ENCOUNTER — Other Ambulatory Visit: Payer: Self-pay | Admitting: Urology

## 2022-01-14 DIAGNOSIS — N401 Enlarged prostate with lower urinary tract symptoms: Secondary | ICD-10-CM

## 2022-02-11 ENCOUNTER — Ambulatory Visit: Payer: PPO | Admitting: Urology

## 2022-10-05 NOTE — Progress Notes (Unsigned)
10/07/22 9:59 AM   Austin Ellis 1941/04/10 [id number]  Referring provider:  Dortha Kern, MD [address] [place],  [place] [address]   Urological history  Elevated PSA  -PSA pending -PSA (2015) 5.4 - prostate biopsy negative -PSA (2016) 4.0 - started on finasteride 5 mg daily   2. BPH with LUTS  -finasteride 5 mg daily and tamsulosin 0.4 mg daily   3. ED  -contributing factors of age, HTN, BPH and history of smoking  4. Family history of prostate cancer  -father   HPI: Austin Ellis is a 82 y.o.male who presents today for a 1 year follow up.    Previous records reviewed.  He states he had a urinary tract infection in October and was prescribed Macrobid by his primary care physician Dr. Quillian Quince.  I do not see these results in epic.  He states his symptoms were intermittent dysuria.  He states after taking the Macrobid he did not feel his symptoms abated.  UA yellow hazy, specific gravity 1.015, pH 5.0, nitrate positive, small leukocytes, 0-5 squamous epithelial cells, 11-20 WBCs, 0-5 RBCs, many bacteria and mucus present.  He is also experiencing spraying of his urinary stream dream especially in the morning.  He also has issues with bilateral hydroceles and they do not allow him to sit on the toilet to urinate because he cannot push his penis down to gain from the toilet.  Patient denies any modifying or aggravating factors.  Patient denies any gross hematuria or suprapubic/flank pain.  Patient denies any fevers, chills, nausea or vomiting.    He is taking tamsulosin 0.4 mg daily and Proscar 5 mg daily.  PVR 20 mL   I PSS 11/3   IPSS     Row Name 10/07/22 0900         International Prostate Symptom Score   How often have you had the sensation of not emptying your bladder? Less than half the time     How often have you had to urinate less than every two hours? Less than half the time     How often have you found you stopped and started again several times  when you urinated? About half the time     How often have you found it difficult to postpone urination? Not at All     How often have you had a weak urinary stream? About half the time     How often have you had to strain to start urination? Not at All     How many times did you typically get up at night to urinate? 1 Time     Total IPSS Score 11       Quality of Life due to urinary symptoms   If you were to spend the rest of your life with your urinary condition just the way it is now how would you feel about that? Mixed              Score:  1-7 Mild 8-19 Moderate 20-35 Severe    PMH: Past Medical History:  Diagnosis Date   Arthritis    Benign enlargement of prostate    Chronic back pain    Elevated PSA    underwent TRUSBx on 05/28/2013 for a PSA of 5.4 ng/ml   Erectile dysfunction    Hypertension    Urinary hesitancy     Surgical History: Past Surgical History:  Procedure Laterality Date   TONSILLECTOMY  Home Medications:  Allergies as of 10/07/2022       Reactions   Cialis [tadalafil]    Back Pain        Medication List        Accurate as of Oct 07, 2022  9:59 AM. If you have any questions, ask your nurse or doctor.          acetaminophen 325 MG tablet Commonly known as: TYLENOL Take 650 mg by mouth every 6 (six) hours as needed.   doxazosin 4 MG tablet Commonly known as: CARDURA Take 4 mg by mouth daily. What changed: Another medication with the same name was removed. Continue taking this medication, and follow the directions you see here. Changed by: Tinisha Etzkorn, PA-C   finasteride 5 MG tablet Commonly known as: PROSCAR TAKE ONE TABLET BY MOUTH DAILY.   hydrochlorothiazide 12.5 MG tablet Commonly known as: HYDRODIURIL   ibuprofen 200 MG tablet Commonly known as: ADVIL Take 200 mg by mouth every 4 (four) hours as needed.   Ibuprofen 200 MG Caps   losartan 100 MG tablet Commonly known as: COZAAR   tamsulosin 0.4 MG Caps  capsule Commonly known as: FLOMAX TAKE (2) CAPSULES BY MOUTH EVERY DAY   VITAMIN B 12 PO Take 1,000 mg by mouth daily.        Allergies:  Allergies  Allergen Reactions   Cialis [Tadalafil]     Back Pain    Family History: Family History  Problem Relation Age of Onset   Prostate cancer Father    Lung cancer Father    Kidney disease Neg Hx    Bladder Cancer Neg Hx    Kidney cancer Neg Hx     Social History:  reports that he has quit smoking. He has never used smokeless tobacco. He reports that he does not drink alcohol and does not use drugs.   Physical Exam: BP (!) 171/74 (BP Location: Left Arm, Patient Position: Sitting, Cuff Size: Normal)   Pulse 67   Ht 5\' 9"  (1.753 m)   Wt 189 lb 6.4 oz (85.9 kg)   BMI 27.97 kg/m   Constitutional:  Well nourished. Alert and oriented, No acute distress. HEENT: [place] AT, moist mucus membranes.  Trachea midline Cardiovascular: No clubbing, cyanosis, or edema. Respiratory: Normal respiratory effort, no increased work of breathing. GU: No CVA tenderness.  No bladder fullness or masses.  Patient with uncircumcised phallus. Foreskin easily retracted  Urethral meatus is patent.  No penile discharge. No penile lesions or rashes. Scrotum without lesions, cysts, rashes and/or edema.  Testicles are located scrotally bilaterally. No masses are appreciated in the testicles. Left and right epididymis are normal.  Right hydrocele noted.  Left hydrocele noted associated with what feels like a left inguinal hernia. Neurologic: Grossly intact, no focal deficits, moving all 4 extremities. Psychiatric: Normal mood and affect.  Laboratory Data: PSA pending Component     Latest Ref Rng 10/07/2022  Color, Urine     YELLOW  YELLOW   Appearance     CLEAR  HAZY !   Specific Gravity, Urine     1.005 - 1.030  1.015   pH     5.0 - 8.0  5.0   Glucose, UA     NEGATIVE mg/dL NEGATIVE   Hgb urine dipstick     NEGATIVE  NEGATIVE   Bilirubin Urine      NEGATIVE  NEGATIVE   Ketones, ur     NEGATIVE mg/dL NEGATIVE   Protein  NEGATIVE mg/dL NEGATIVE   Nitrite     NEGATIVE  POSITIVE !   Leukocytes,Ua     NEGATIVE  SMALL !   Squamous Epithelial / HPF     0 - 5 /HPF 0-5   WBC, UA     0 - 5 WBC/hpf 11-20   RBC / HPF     0 - 5 RBC/hpf 0-5   Bacteria, UA     NONE SEEN  MANY !   Mucus PRESENT     Legend: ! Abnormal I have reviewed the labs.   Pertinent Imaging  10/07/22 10:00  Scan Result 20ml   KUB pending   Assessment & Plan:    1. BPH with LUTS -PSA pending -PVR < 300 cc  -most bothersome symptoms are spraying of the urinary stream -continue conservative management, avoiding bladder irritants and timed voiding's -Continue tamsulosin 0.4 mg daily and finasteride 5 mg daily  2. rUTI's/dysuria -He states he was given Macrobid for UTI in October for intermittent dysuria and his symptoms did not abate -UA is nitrite positive with pyuria and bacteriuria -Urine is sent for culture we will wait on culture and sensitivities before prescribing antibiotic -He has concerns that his symptoms have not abated with the use of antibiotics -We discussed that he may be colonized and the dysuria is the result from his prostate or possibly stones -We will obtain a KUB today to see if there are any radiopaque calcifications noted be suspicious for kidney/bladder stones, I will call him with those results -We also discussed undergoing cystoscopy for further evaluation especially since he has spraying of the urinary stream which indicate he may have stricture/B00 and the cystoscopy will help identify that -Explained that the cystoscopy is a safe and common diagnostic test performed by one of our physicians  in the office.  It consist of using a thin, lighted tube to look directly inside the bladder, prostate and urethra to evaluate the anatomy.  The procedure is brief, typically taking about 5 minutes. -This will unable [place] to assess bladder  health, diagnose and enlarged prostate, assess which BPH procedure may be most appropriate and rule out other bladder conditions (stricture disease, stones, cancer, etc.)  -Advised the patient that there are no restrictions to eating or drinking prior to the cystoscopy -They can continue to take all of their medications as prescribed -They can drive themselves to and from the appointment -I explained that during the procedure, the area around the urethra will be cleansed thoroughly, topical anesthetic will be applied to numb your urethra, the thin tube is then gently inserted through the urethra into your bladder while fluid flows through the tube to the bladder to enable better visualization -I explained the procedure is usually not painful, however there may be some discomfort (pinching feeling), and they may feel an urge to urinate, coolness or fullness in the bladder and then the cystoscope is removed -After the cystoscopy, I advised them that they may experience urinary frequency, and infection, hematuria, dysuria which will resolve within 24 to 48 hours -Reviewed red flag signs (fever, bright red blood or blood clots in the urine, abdominal pain or difficulty urinating) and to contact the office immediately or seek treatment in the ED if they should experience any of these -The physician will discuss the results of the cystoscopy at the time of the procedure    Return for cysto for dysuria/BOO .  Cloretta Ned  [facility] [address]  [address] [place], [place] [address] [phone]

## 2022-10-07 ENCOUNTER — Ambulatory Visit
Admission: RE | Admit: 2022-10-07 | Discharge: 2022-10-07 | Disposition: A | Discharge status: Home / Self Care | Payer: PPO | Attending: Urology | Admitting: Urology

## 2022-10-07 ENCOUNTER — Encounter: Payer: Self-pay | Admitting: Urology

## 2022-10-07 ENCOUNTER — Telehealth: Payer: Self-pay | Admitting: Urology

## 2022-10-07 ENCOUNTER — Other Ambulatory Visit
Admission: RE | Admit: 2022-10-07 | Discharge: 2022-10-07 | Disposition: A | Discharge status: Home / Self Care | Payer: PPO | Source: Home / Self Care | Attending: Urology | Admitting: Urology

## 2022-10-07 ENCOUNTER — Ambulatory Visit
Admission: RE | Admit: 2022-10-07 | Discharge: 2022-10-07 | Disposition: A | Discharge status: Home / Self Care | Payer: PPO | Source: Ambulatory Visit | Attending: Urology | Admitting: Urology

## 2022-10-07 ENCOUNTER — Ambulatory Visit (INDEPENDENT_AMBULATORY_CARE_PROVIDER_SITE_OTHER): Payer: PPO | Admitting: Urology

## 2022-10-07 ENCOUNTER — Other Ambulatory Visit: Payer: Self-pay

## 2022-10-07 VITALS — BP 171/74 | HR 67 | Ht 69.0 in | Wt 189.4 lb

## 2022-10-07 DIAGNOSIS — R300 Dysuria: Secondary | ICD-10-CM

## 2022-10-07 DIAGNOSIS — N138 Other obstructive and reflux uropathy: Secondary | ICD-10-CM

## 2022-10-07 DIAGNOSIS — N390 Urinary tract infection, site not specified: Secondary | ICD-10-CM

## 2022-10-07 DIAGNOSIS — Z8042 Family history of malignant neoplasm of prostate: Secondary | ICD-10-CM

## 2022-10-07 DIAGNOSIS — R972 Elevated prostate specific antigen [PSA]: Secondary | ICD-10-CM | POA: Insufficient documentation

## 2022-10-07 DIAGNOSIS — N401 Enlarged prostate with lower urinary tract symptoms: Secondary | ICD-10-CM | POA: Insufficient documentation

## 2022-10-07 DIAGNOSIS — Z87440 Personal history of urinary (tract) infections: Secondary | ICD-10-CM

## 2022-10-07 DIAGNOSIS — R3989 Other symptoms and signs involving the genitourinary system: Secondary | ICD-10-CM | POA: Diagnosis present

## 2022-10-07 LAB — URINALYSIS, COMPLETE (UACMP) WITH MICROSCOPIC
Bilirubin Urine: NEGATIVE
Glucose, UA: NEGATIVE mg/dL
Hgb urine dipstick: NEGATIVE
Ketones, ur: NEGATIVE mg/dL
Nitrite: POSITIVE — AB
Protein, ur: NEGATIVE mg/dL
Specific Gravity, Urine: 1.015 (ref 1.005–1.030)
pH: 5 (ref 5.0–8.0)

## 2022-10-07 LAB — BLADDER SCAN AMB NON-IMAGING

## 2022-10-07 LAB — PSA: Prostatic Specific Antigen: 1.83 ng/mL (ref 0.00–4.00)

## 2022-10-07 NOTE — Telephone Encounter (Signed)
error 

## 2022-10-07 NOTE — Patient Instructions (Signed)

## 2022-10-09 ENCOUNTER — Other Ambulatory Visit: Payer: Self-pay | Admitting: Urology

## 2022-10-09 DIAGNOSIS — N390 Urinary tract infection, site not specified: Secondary | ICD-10-CM

## 2022-10-09 LAB — URINE CULTURE: Culture: >=100000 — AB

## 2022-10-09 MED ORDER — CIPROFLOXACIN HCL 250 MG PO TABS: 250.0000 mg | ORAL_TABLET | Freq: Two times a day (BID) | ORAL | 0 refills | Status: DC

## 2022-10-22 ENCOUNTER — Other Ambulatory Visit
Admission: RE | Admit: 2022-10-22 | Discharge: 2022-10-22 | Disposition: A | Discharge status: Home / Self Care | Payer: PPO | Attending: Urology | Admitting: Urology

## 2022-10-22 ENCOUNTER — Other Ambulatory Visit: Payer: Self-pay | Admitting: *Deleted

## 2022-10-22 DIAGNOSIS — N390 Urinary tract infection, site not specified: Secondary | ICD-10-CM | POA: Insufficient documentation

## 2022-10-22 LAB — URINALYSIS, COMPLETE (UACMP) WITH MICROSCOPIC
Bilirubin Urine: NEGATIVE
Glucose, UA: NEGATIVE mg/dL
Hgb urine dipstick: NEGATIVE
Ketones, ur: NEGATIVE mg/dL
Leukocytes,Ua: NEGATIVE
Nitrite: NEGATIVE
Protein, ur: NEGATIVE mg/dL
RBC / HPF: NONE SEEN RBC/hpf (ref 0–5)
Specific Gravity, Urine: 1.025 (ref 1.005–1.030)
pH: 5.5 (ref 5.0–8.0)

## 2022-10-23 LAB — URINE CULTURE: Culture: NO GROWTH

## 2022-11-01 ENCOUNTER — Other Ambulatory Visit
Admission: RE | Admit: 2022-11-01 | Discharge: 2022-11-01 | Disposition: A | Discharge status: Home / Self Care | Payer: PPO | Attending: Urology | Admitting: Urology

## 2022-11-01 ENCOUNTER — Encounter: Payer: Self-pay | Admitting: Urology

## 2022-11-01 ENCOUNTER — Other Ambulatory Visit: Payer: Self-pay

## 2022-11-01 ENCOUNTER — Ambulatory Visit (INDEPENDENT_AMBULATORY_CARE_PROVIDER_SITE_OTHER): Payer: PPO | Admitting: Urology

## 2022-11-01 VITALS — BP 167/78 | HR 78 | Ht 69.0 in | Wt 189.0 lb

## 2022-11-01 DIAGNOSIS — Z87440 Personal history of urinary (tract) infections: Secondary | ICD-10-CM | POA: Diagnosis not present

## 2022-11-01 DIAGNOSIS — R300 Dysuria: Secondary | ICD-10-CM | POA: Diagnosis present

## 2022-11-01 DIAGNOSIS — N390 Urinary tract infection, site not specified: Secondary | ICD-10-CM | POA: Insufficient documentation

## 2022-11-01 DIAGNOSIS — N401 Enlarged prostate with lower urinary tract symptoms: Secondary | ICD-10-CM

## 2022-11-01 DIAGNOSIS — N138 Other obstructive and reflux uropathy: Secondary | ICD-10-CM

## 2022-11-01 DIAGNOSIS — R39198 Other difficulties with micturition: Secondary | ICD-10-CM | POA: Diagnosis not present

## 2022-11-01 LAB — URINALYSIS, COMPLETE (UACMP) WITH MICROSCOPIC
Bilirubin Urine: NEGATIVE
Glucose, UA: NEGATIVE mg/dL
Leukocytes,Ua: NEGATIVE
Nitrite: NEGATIVE
Protein, ur: NEGATIVE mg/dL
Specific Gravity, Urine: 1.02 (ref 1.005–1.030)
pH: 5.5 (ref 5.0–8.0)

## 2022-11-01 MED ORDER — LIDOCAINE HCL URETHRAL/MUCOSAL 2 % EX GEL
1.0000 | Freq: Once | CUTANEOUS | Status: AC
  Administered 2022-11-01: 1 via URETHRAL

## 2022-11-01 NOTE — Progress Notes (Signed)
   11/01/22  CC:  Chief Complaint  Patient presents with   Cysto    HPI: 82 year old male with history of elevated PSA, BPH with LUTS and increasing issues with spraying of his urinary stream and deviation of his stream.  He is also a few recent urinary tract infections.  Today he reports issues with burning with urination.  His urinalysis is bland with a few microscopic red blood cells.  Blood pressure (!) 167/78, pulse 78, height 5\' 9"  (1.753 m), weight 189 lb (85.7 kg). NED. A&Ox3.   No respiratory distress   Abd soft, NT, ND Normal external genitalia with patent urethral meatus  Cystoscopy Procedure Note  Patient identification was confirmed, informed consent was obtained, and patient was prepped using Betadine solution.  Lidocaine jelly was administered per urethral meatus.    Procedure: - Flexible cystoscope introduced, without any difficulty.   - Thorough search of the bladder revealed:    normal urethral meatus    Enlarged prostate with bilobar coaptation and mildly elevated bladder neck without a discrete median lobe    no stones    no ulcers     no tumors    no urethral polyps    no trabeculation  - Ureteral orifices were normal in position and appearance.  -Retroflexion shows hypervascularity along the prostatic mucosa.  Very subtle intravesical protrusion with irritative changes on the surface but no concern for malignancy.  Post-Procedure: - Patient tolerated the procedure well  Assessment/ Plan:  1. Recurrent UTI Likely secondary to infection today - lidocaine (XYLOCAINE) 2 % jelly 1 Application  2. BPH with obstruction/lower urinary tract symptoms Cystoscopy today with significantly large prostate but no obstruction or stones malignancy appreciated  He is currently managed with finasteride.  Could consider outlet procedure down the road improvement after this discussion today.  Has been trying to hold off on his.  If symptoms worsen or progress, would  strongly advocate for this.  In that situation, he will need prostate sizing to recommend modality. - lidocaine (XYLOCAINE) 2 % jelly 1 Application  Follow-up in 6 months [facility] IPSS/ PVR  Vanna Scotland, MD

## 2023-01-13 ENCOUNTER — Other Ambulatory Visit: Payer: Self-pay | Admitting: Urology

## 2023-01-13 DIAGNOSIS — N401 Enlarged prostate with lower urinary tract symptoms: Secondary | ICD-10-CM

## 2023-04-29 NOTE — Progress Notes (Signed)
05/05/23 9:30 AM   [name] 1940-06-30 [id number]  Referring provider:  Dortha Kern, MD [address] [place],  [place] [address]   Urological history  Elevated PSA  -PSA (09/2022) 1.83 (3.66)  -PSA (2015) 5.4 - prostate biopsy negative -PSA (2016) 4.0 - started on finasteride 5 mg daily   2. BPH with LUTS  -cysto (10/2022) -enlarged prostate with bilobar coaptation and mildly elevated bladder neck, no median lobe, prostatic hypervascularity -finasteride 5 mg daily and tamsulosin 0.4 mg daily   3. ED  -contributing factors of age, HTN, BPH and history of smoking  4. Family history of prostate cancer  -father   HPI: Austin Ellis is a 82 y.o.male who presents today for a 1 year follow up.    Previous records reviewed.  I PSS 15/4  PVR 0 mL   His biggest thing is his scrotal swelling.  He has found to be uncomfortable and interfere with wearing clothing.  He also states his penis is covered by the hydroceles.  He cannot tolerate the weak urinary stream.  He states it actually gets stronger as the day goes on.  Patient denies any modifying or aggravating factors.  Patient denies any recent UTI's, gross hematuria, dysuria or suprapubic/flank pain.  Patient denies any fevers, chills, nausea or vomiting.     IPSS     Row Name 05/05/23 0900         International Prostate Symptom Score   How often have you had the sensation of not emptying your bladder? Less than 1 in 5     How often have you had to urinate less than every two hours? More than half the time     How often have you found you stopped and started again several times when you urinated? Less than half the time     How often have you found it difficult to postpone urination? Less than 1 in 5 times     How often have you had a weak urinary stream? More than half the time     How often have you had to strain to start urination? Less than 1 in 5 times     How many times did you typically get up at night  to urinate? 2 Times     Total IPSS Score 15       Quality of Life due to urinary symptoms   If you were to spend the rest of your life with your urinary condition just the way it is now how would you feel about that? Mostly Disatisfied               Score:  1-7 Mild 8-19 Moderate 20-35 Severe    PMH: Past Medical History:  Diagnosis Date   Arthritis    Benign enlargement of prostate    Chronic back pain    Elevated PSA    underwent TRUSBx on 05/28/2013 for a PSA of 5.4 ng/ml   Erectile dysfunction    Hypertension    Urinary hesitancy     Surgical History: Past Surgical History:  Procedure Laterality Date   TONSILLECTOMY      Home Medications:  Allergies as of 05/05/2023       Reactions   Cialis [tadalafil]    Back Pain        Medication List        Accurate as of May 05, 2023  9:30 AM. If you have any questions, ask your nurse  or doctor.          STOP taking these medications    hydrochlorothiazide 12.5 MG tablet Commonly known as: HYDRODIURIL       TAKE these medications    acetaminophen 325 MG tablet Commonly known as: TYLENOL Take 650 mg by mouth every 6 (six) hours as needed.   finasteride 5 MG tablet Commonly known as: PROSCAR TAKE ONE TABLET BY MOUTH DAILY.   furosemide 20 MG tablet Commonly known as: LASIX Take 20 mg by mouth daily.   ibuprofen 200 MG tablet Commonly known as: ADVIL Take 200 mg by mouth every 4 (four) hours as needed.   Ibuprofen 200 MG Caps   losartan 100 MG tablet Commonly known as: COZAAR   tamsulosin 0.4 MG Caps capsule Commonly known as: FLOMAX TAKE (2) CAPSULES BY MOUTH EVERY DAY   VITAMIN B 12 PO Take 1,000 mg by mouth daily.        Allergies:  Allergies  Allergen Reactions   Cialis [Tadalafil]     Back Pain    Family History: Family History  Problem Relation Age of Onset   Prostate cancer Father    Lung cancer Father    Kidney disease Neg Hx    Bladder Cancer Neg Hx     Kidney cancer Neg Hx     Social History:  reports that he has quit smoking. He has never used smokeless tobacco. He reports that he does not drink alcohol and does not use drugs.   Physical Exam: BP (!) 176/81 (BP Location: Left Arm, Patient Position: Sitting, Cuff Size: Normal)   Pulse 75   Ht 5\' 9"  (1.753 m)   Wt 186 lb (84.4 kg)   BMI 27.47 kg/m   Constitutional:  Well nourished. Alert and oriented, No acute distress. HEENT: [place] AT, moist mucus membranes.  Trachea midline Cardiovascular: No clubbing, cyanosis, or edema. Respiratory: Normal respiratory effort, no increased work of breathing. GU: No CVA tenderness.  No bladder fullness or masses.  Patient with uncircumcised phallus. Foreskin easily retracted  Urethral meatus is patent.  No penile discharge. No penile lesions or rashes. Scrotum without lesions, cysts, rashes and/or edema.  Bilateral hydroceles are noted.  A left inguinal hernia is noted.   Neurologic: Grossly intact, no focal deficits, moving all 4 extremities. Psychiatric: Normal mood and affect.   Laboratory Data: Urinalysis See EPIC and HPI I have reviewed the labs.  See HPI.     Pertinent Imaging  05/05/23 09:06  Scan Result 0ml    Assessment & Plan:    1. BPH with LUTS -PSA stable  -PVR < 300 cc  -most bothersome symptoms is weak urinary stream  -continue conservative management, avoiding bladder irritants and timed voiding's -Continue tamsulosin 0.4 mg daily and finasteride 5 mg daily  2.  Erectile dysfunction -He is no longer sexually active  3.  Bilateral hydroceles -These have become bothersome and he would like them addressed -Explained that we will get a scrotal ultrasound to further evaluate the internal scrotal contents since cannot palpate the testicles or the epididymis completely  4.  Left inguinal hernia -It has become bothersome to him and he would like the hernia addressed -I sent a referral to [facility] here in [place] for  further evaluation and management  Return in 1 month to review scrotal ultrasound results, reviewed with general surgery's recommendations concerning hernia and discuss possible treatment for hydroceles  Cloretta Ned   [facility] [address]  [address] [place], [place] [address] [phone]

## 2023-05-05 ENCOUNTER — Encounter: Payer: Self-pay | Admitting: Urology

## 2023-05-05 ENCOUNTER — Ambulatory Visit: Payer: PPO | Admitting: Urology

## 2023-05-05 VITALS — BP 176/81 | HR 75 | Ht 69.0 in | Wt 186.0 lb

## 2023-05-05 DIAGNOSIS — N138 Other obstructive and reflux uropathy: Secondary | ICD-10-CM

## 2023-05-05 DIAGNOSIS — K4090 Unilateral inguinal hernia, without obstruction or gangrene, not specified as recurrent: Secondary | ICD-10-CM

## 2023-05-05 DIAGNOSIS — N401 Enlarged prostate with lower urinary tract symptoms: Secondary | ICD-10-CM | POA: Diagnosis not present

## 2023-05-05 DIAGNOSIS — Z125 Encounter for screening for malignant neoplasm of prostate: Secondary | ICD-10-CM

## 2023-05-05 DIAGNOSIS — N433 Hydrocele, unspecified: Secondary | ICD-10-CM

## 2023-05-05 DIAGNOSIS — N529 Male erectile dysfunction, unspecified: Secondary | ICD-10-CM | POA: Diagnosis not present

## 2023-05-05 LAB — BLADDER SCAN AMB NON-IMAGING

## 2023-05-07 NOTE — Progress Notes (Signed)
Patient ID: Austin Ellis, male   DOB: [birth year], 82 y.o.   MRN: [id number]  Chief Complaint: Left inguinal hernia of 4 to 5 years.  History of Present Illness Austin Ellis is a 82 y.o. male with progressive enlargement and descent of the left inguinal hernia over the last 4 to 5 years.  Currently unable to reduce it.  Denies bowel habit changes, nausea, vomiting, fevers or chills.  Reports progressive discomfort.  Denies any acute pain or event.  No known pain on the right side.  Past Medical History Past Medical History:  Diagnosis Date   Arthritis    Benign enlargement of prostate    Chronic back pain    Elevated PSA    underwent TRUSBx on 05/28/2013 for a PSA of 5.4 ng/ml   Erectile dysfunction    Hypertension    Urinary hesitancy       Past Surgical History:  Procedure Laterality Date   TONSILLECTOMY      Allergies  Allergen Reactions   Cialis [Tadalafil]     Back Pain    Current Outpatient Medications  Medication Sig Dispense Refill   acetaminophen (TYLENOL) 325 MG tablet Take 650 mg by mouth every 6 (six) hours as needed.     Cyanocobalamin (VITAMIN B 12 PO) Take 1,000 mg by mouth daily.     finasteride (PROSCAR) 5 MG tablet TAKE ONE TABLET BY MOUTH DAILY. 90 tablet 3   furosemide (LASIX) 20 MG tablet Take 20 mg by mouth daily.     ibuprofen (ADVIL,MOTRIN) 200 MG tablet Take 200 mg by mouth every 4 (four) hours as needed.     Ibuprofen 200 MG CAPS      losartan (COZAAR) 100 MG tablet      tamsulosin (FLOMAX) 0.4 MG CAPS capsule TAKE (2) CAPSULES BY MOUTH EVERY DAY 180 capsule 3   No current facility-administered medications for this visit.    Family History Family History  Problem Relation Age of Onset   Prostate cancer Father    Lung cancer Father    Kidney disease Neg Hx    Bladder Cancer Neg Hx    Kidney cancer Neg Hx       Social History Social History   Tobacco Use   Smoking status: Former   Smokeless tobacco: Never   Tobacco comments:     quit 21 years ago  Substance Use Topics   Alcohol use: No    Alcohol/week: 0.0 standard drinks of alcohol   Drug use: No        Review of Systems  Constitutional: Negative.   HENT:  Positive for hearing loss and tinnitus.   Eyes: Negative.   Respiratory: Negative.    Cardiovascular: Negative.   Gastrointestinal: Negative.   Genitourinary:  Positive for frequency.  Skin: Negative.   Neurological: Negative.   Psychiatric/Behavioral: Negative.       Physical Exam Blood pressure (!) 184/83, pulse 96, temperature 97.9 F (36.6 C), temperature source Oral, height 5\' 9"  (1.753 m), weight 189 lb 9.6 oz (86 kg), SpO2 97%. Last Weight  Most recent update: 05/08/2023  2:21 PM    Weight  86 kg (189 lb 9.6 oz)             CONSTITUTIONAL: Well developed, and nourished, appropriately responsive and aware without distress.   EYES: Sclera non-icteric.   EARS, NOSE, MOUTH AND THROAT:  The oropharynx is clear. Oral mucosa is pink and moist.    Hearing is intact to voice.  NECK: Trachea is midline, and there is no jugular venous distension.  LYMPH NODES:  Lymph nodes in the neck are not appreciated. RESPIRATORY:  Lungs are clear, and breath sounds are equal bilaterally.  Normal respiratory effort without pathologic use of accessory muscles. CARDIOVASCULAR: Heart is regular in rate and rhythm.   Well perfused.  GI: The abdomen is  soft, nontender, and nondistended. There were no palpable masses.  I did not appreciate hepatosplenomegaly.  GU: Large left inguinal hernia, possibly involving sliding component sigmoid colon potential.  Readily reducible with minimal discomfort evident.  No appreciable right inguinal hernia.  MUSCULOSKELETAL:  Symmetrical muscle tone appreciated in all four extremities.    SKIN: Skin turgor is normal. No pathologic skin lesions appreciated.  NEUROLOGIC:  Motor and sensation appear grossly normal.  Cranial nerves are grossly without defect. PSYCH:  Alert and  oriented to person, place and time. Affect is appropriate for situation.  Data Reviewed I have personally reviewed what is currently available of the patient's imaging, recent labs and medical records.   Labs:      No data to display             No data to display            Imaging: Radiological images reviewed:   Within last 24 hrs: No results found.  Assessment    Left inguinal hernia Patient Active Problem List   Diagnosis Date Noted   BPH with obstruction/lower urinary tract symptoms 12/08/2014   Elevated PSA 12/08/2014   Erectile dysfunction of organic origin 12/08/2014    Plan    Robotic repair of left inguinal hernia, possible bilateral.  I discussed possibility of incarceration, strangulation, enlargement in size over time, and the need for emergency surgery in the face of these.  Also reviewed the techniques of reduction should incarceration occur, and when unsuccessful to present to the ED.  Also discussed that surgery risks include recurrence which can be up to 30% in the case of complex hernias, use of prosthetic materials (mesh) and the increased risk of infection and the possible need for re-operation and removal of mesh, possibility of post-op SBO or ileus, and the risks of general anesthetic including heart attack, stroke, sudden death or some reaction to anesthetic medications. The patient, and those present, appear to understand the risks, any and all questions were answered to the patient's satisfaction.  No guarantees were ever expressed or implied.  Face-to-face time spent with the patient and accompanying care providers(if present) was 30 minutes, with more than 50% of the time spent counseling, educating, and coordinating care of the patient.    These notes generated with voice recognition software. I apologize for typographical errors.  Campbell Lerner M.D., FACS 05/08/2023, 2:38 PM

## 2023-05-08 ENCOUNTER — Telehealth: Payer: Self-pay | Admitting: Surgery

## 2023-05-08 ENCOUNTER — Ambulatory Visit (INDEPENDENT_AMBULATORY_CARE_PROVIDER_SITE_OTHER): Payer: PPO | Admitting: Surgery

## 2023-05-08 ENCOUNTER — Encounter: Payer: Self-pay | Admitting: Surgery

## 2023-05-08 VITALS — BP 184/83 | HR 96 | Temp 97.9°F | Ht 69.0 in | Wt 189.6 lb

## 2023-05-08 DIAGNOSIS — K4090 Unilateral inguinal hernia, without obstruction or gangrene, not specified as recurrent: Secondary | ICD-10-CM

## 2023-05-08 NOTE — Telephone Encounter (Signed)
Patient has been advised of Pre-Admission date/time, and Surgery date at [facility].  Surgery Date: 05/28/23 Preadmission Testing Date: 05/20/23 (phone 8am-1p)  Patient has been made aware to call [phone], between 1-3:00pm the day before surgery, to find out what time to arrive for surgery.

## 2023-05-08 NOTE — Patient Instructions (Signed)
 You have chose to have your hernia repaired. This will be done by Dr. Claudine Mouton at [facility].  Please see your (blue) Pre-care information that you have been given today. Our surgery scheduler will call you to verify surgery date and to go over information.   You will need to arrange to be out of work for approximately 1-2 weeks and then you may return with a lifting restriction for 4 more weeks. If you have FMLA or Disability paperwork that needs to be filled out, please have your company fax your paperwork to [phone] or you may drop this by either office. This paperwork will be filled out within 3 days after your surgery has been completed.  You may have a bruise in your groin and also swelling and brusing in your testicle area. You may use ice 4-5 times daily for 15-20 minutes each time. Make sure that you place a barrier between you and the ice pack. To decrease the swelling, you may roll up a bath towel and place it vertically in between your thighs with your testicles resting on the towel. You will want to keep this area elevated as much as possible for several days following surgery.    Inguinal Hernia, Adult Muscles help keep everything in the body in its proper place. But if a weak spot in the muscles develops, something can poke through. That is called a hernia. When this happens in the lower part of the belly (abdomen), it is called an inguinal hernia. (It takes its name from a part of the body in this region called the inguinal canal.) A weak spot in the wall of muscles lets some fat or part of the small intestine bulge through. An inguinal hernia can develop at any age. Men get them more often than women. CAUSES  In adults, an inguinal hernia develops over time. It can be triggered by: Suddenly straining the muscles of the lower abdomen. Lifting heavy objects. Straining to have a bowel movement. Difficult bowel movements (constipation) can lead to this. Constant coughing.  This may be caused by smoking or lung disease. Being overweight. Being pregnant. Working at a job that requires long periods of standing or heavy lifting. Having had an inguinal hernia before. One type can be an emergency situation. It is called a strangulated inguinal hernia. It develops if part of the small intestine slips through the weak spot and cannot get back into the abdomen. The blood supply can be cut off. If that happens, part of the intestine may die. This situation requires emergency surgery. SYMPTOMS  Often, a small inguinal hernia has no symptoms. It is found when a healthcare provider does a physical exam. Larger hernias usually have symptoms.  In adults, symptoms may include: A lump in the groin. This is easier to see when the person is standing. It might disappear when lying down. In men, a lump in the scrotum. Pain or burning in the groin. This occurs especially when lifting, straining or coughing. A dull ache or feeling of pressure in the groin. Signs of a strangulated hernia can include: A bulge in the groin that becomes very painful and tender to the touch. A bulge that turns red or purple. Fever, nausea and vomiting. Inability to have a bowel movement or to pass gas. DIAGNOSIS  To decide if you have an inguinal hernia, a healthcare provider will probably do a physical examination. This will include asking questions about any symptoms you have noticed. The healthcare provider might  feel the groin area and ask you to cough. If an inguinal hernia is felt, the healthcare provider may try to slide it back into the abdomen. Usually no other tests are needed. TREATMENT  Treatments can vary. The size of the hernia makes a difference. Options include: Watchful waiting. This is often suggested if the hernia is small and you have had no symptoms. No medical procedure will be done unless symptoms develop. You will need to watch closely for symptoms. If any occur, contact your  healthcare provider right away. Surgery. This is used if the hernia is larger or you have symptoms. Open surgery. This is usually an outpatient procedure (you will not stay overnight in a hospital). An cut (incision) is made through the skin in the groin. The hernia is put back inside the abdomen. The weak area in the muscles is then repaired by herniorrhaphy or hernioplasty. Herniorrhaphy: in this type of surgery, the weak muscles are sewn back together. Hernioplasty: a patch or mesh is used to close the weak area in the abdominal wall. Laparoscopy. In this procedure, a surgeon makes small incisions. A thin tube with a tiny video camera (called a laparoscope) is put into the abdomen. The surgeon repairs the hernia with mesh by looking with the video camera and using two long instruments. HOME CARE INSTRUCTIONS  After surgery to repair an inguinal hernia: You will need to take pain medicine prescribed by your healthcare provider. Follow all directions carefully. You will need to take care of the wound from the incision. Your activity will be restricted for awhile. This will probably include no heavy lifting for several weeks. You also should not do anything too active for a few weeks. When you can return to work will depend on the type of job that you have. During "watchful waiting" periods, you should: Maintain a healthy weight. Eat a diet high in fiber (fruits, vegetables and whole grains). Drink plenty of fluids to avoid constipation. This means drinking enough water and other liquids to keep your urine clear or pale yellow. Do not lift heavy objects. Do not stand for long periods of time. Quit smoking. This should keep you from developing a frequent cough. SEEK MEDICAL CARE IF:  A bulge develops in your groin area. You feel pain, a burning sensation or pressure in the groin. This might be worse if you are lifting or straining. You develop a fever of more than 100.5 F (38.1 C). SEEK  IMMEDIATE MEDICAL CARE IF:  Pain in the groin increases suddenly. A bulge in the groin gets bigger suddenly and does not go down. For men, there is sudden pain in the scrotum. Or, the size of the scrotum increases. A bulge in the groin area becomes red or purple and is painful to touch. You have nausea or vomiting that does not go away. You feel your heart beating much faster than normal. You cannot have a bowel movement or pass gas. You develop a fever of more than 102.0 F (38.9 C).   This information is not intended to replace advice given to you by your health care provider. Make sure you discuss any questions you have with your health care provider.   Document Released: 09/22/2008 Document Revised: 07/29/2011 Document Reviewed: 11/07/2014 Elsevier Interactive Patient Education [employer]

## 2023-05-09 ENCOUNTER — Ambulatory Visit: Payer: Self-pay | Admitting: Surgery

## 2023-05-09 DIAGNOSIS — K4090 Unilateral inguinal hernia, without obstruction or gangrene, not specified as recurrent: Secondary | ICD-10-CM | POA: Insufficient documentation

## 2023-05-09 HISTORY — DX: Unilateral inguinal hernia, without obstruction or gangrene, not specified as recurrent: K40.90

## 2023-05-19 ENCOUNTER — Ambulatory Visit
Admission: RE | Admit: 2023-05-19 | Discharge: 2023-05-19 | Disposition: A | Discharge status: Home / Self Care | Payer: PPO | Source: Ambulatory Visit | Attending: Urology | Admitting: Urology

## 2023-05-19 DIAGNOSIS — N433 Hydrocele, unspecified: Secondary | ICD-10-CM | POA: Diagnosis present

## 2023-05-20 ENCOUNTER — Other Ambulatory Visit: Payer: Self-pay

## 2023-05-20 ENCOUNTER — Encounter
Admission: RE | Admit: 2023-05-20 | Discharge: 2023-05-20 | Disposition: A | Discharge status: Home / Self Care | Payer: PPO | Source: Ambulatory Visit | Attending: Surgery | Admitting: Surgery

## 2023-05-20 VITALS — Ht 69.0 in | Wt 186.0 lb

## 2023-05-20 DIAGNOSIS — I10 Essential (primary) hypertension: Secondary | ICD-10-CM

## 2023-05-20 HISTORY — DX: Prediabetes: R73.03

## 2023-05-20 NOTE — Patient Instructions (Addendum)
 Your procedure is scheduled on: Wednesday May 28, 2023. Report to the Registration Desk on the 1st floor of the Medical Mall. To find out your arrival time, please call [phone] between 1PM - 3PM on: Tuesday 05/27/23 If your arrival time is 6:00 am, do not arrive before that time as the Medical Mall entrance doors do not open until 6:00 am.  REMEMBER: Instructions that are not followed completely may result in serious medical risk, up to and including death; or upon the discretion of your surgeon and anesthesiologist your surgery may need to be rescheduled.  Do not eat food after midnight the night before surgery.  No gum chewing or hard candies.   One week prior to surgery: Stop Anti-inflammatories (NSAIDS) such as Advil, Aleve, Ibuprofen, Motrin, Naproxen, Naprosyn and Aspirin based products such as Excedrin, Goody's Powder, BC Powder. Stop ANY OVER THE COUNTER supplements until after surgery.  You may however, continue to take Tylenol  if needed for pain up until the day of surgery.   Continue taking all of your other prescription medications up until the day of surgery.  ON THE DAY OF SURGERY ONLY TAKE THESE MEDICATIONS WITH SIPS OF WATER:  finasteride  (PROSCAR ) 5 MG    No Alcohol  for 24 hours before or after surgery.  No Smoking including e-cigarettes for 24 hours before surgery.  No chewable tobacco products for at least 6 hours before surgery.  No nicotine patches on the day of surgery.  Do not use any recreational drugs for at least a week (preferably 2 weeks) before your surgery.  Please be advised that the combination of cocaine and anesthesia may have negative outcomes, up to and including death. If you test positive for cocaine, your surgery will be cancelled.  On the morning of surgery brush your teeth with toothpaste and water, you may rinse your mouth with mouthwash if you wish. Do not swallow any toothpaste or mouthwash.  Use CHG Soap or wipes as  directed on instruction sheet.  Do not wear jewelry, make-up, hairpins, clips or nail polish.  For welded (permanent) jewelry: bracelets, anklets, waist bands, etc.  Please have this removed prior to surgery.  If it is not removed, there is a chance that hospital personnel will need to cut it off on the day of surgery.  Do not wear lotions, powders, or perfumes.   Do not shave body hair from the neck down 48 hours before surgery.  Contact lenses, hearing aids and dentures may not be worn into surgery.  Do not bring valuables to the hospital. [facility] is not responsible for any missing/lost belongings or valuables.   Notify your doctor if there is any change in your medical condition (cold, fever, infection).  Wear comfortable clothing (specific to your surgery type) to the hospital.  After surgery, you can help prevent lung complications by doing breathing exercises.  Take deep breaths and cough every 1-2 hours. Your doctor may order a device called an Incentive Spirometer to help you take deep breaths. When coughing or sneezing, hold a pillow firmly against your incision with both hands. This is called "splinting." Doing this helps protect your incision. It also decreases belly discomfort.  If you are being admitted to the hospital overnight, leave your suitcase in the car. After surgery it may be brought to your room.  In case of increased patient census, it may be necessary for you, the patient, to continue your postoperative care in the Same Day Surgery department.  If you  are being discharged the day of surgery, you will not be allowed to drive home. You will need a responsible individual to drive you home and stay with you for 24 hours after surgery.   If you are taking public transportation, you will need to have a responsible individual with you.  Please call the Pre-admissions Testing Dept. at [phone] if you have any questions about these instructions.  Surgery  Visitation Policy:  Patients having surgery or a procedure may have two visitors.  Children under the age of 34 must have an adult with them who is not the patient.  Inpatient Visitation:    Visiting hours are 7 a.m. to 8 p.m. Up to four visitors are allowed at one time in a patient room. The visitors may rotate out with other people during the day.  One visitor age 22 or older may stay with the patient overnight and must be in the room by 8 p.m.     Preparing for Surgery with CHLORHEXIDINE  GLUCONATE (CHG) Soap  Chlorhexidine  Gluconate (CHG) Soap  o An antiseptic cleaner that kills germs and bonds with the skin to continue killing germs even after washing  o Used for showering the night before surgery and morning of surgery  Before surgery, you can play an important role by reducing the number of germs on your skin.  CHG (Chlorhexidine  gluconate) soap is an antiseptic cleanser which kills germs and bonds with the skin to continue killing germs even after washing.  Please do not use if you have an allergy to CHG or antibacterial soaps. If your skin becomes reddened/irritated stop using the CHG.  1. Shower the NIGHT BEFORE SURGERY and the MORNING OF SURGERY with CHG soap.  2. If you choose to wash your hair, wash your hair first as usual with your normal shampoo.  3. After shampooing, rinse your hair and body thoroughly to remove the shampoo.  4. Use CHG as you would any other liquid soap. You can apply CHG directly to the skin and wash gently with a scrungie or a clean washcloth.  5. Apply the CHG soap to your body only from the neck down. Do not use on open wounds or open sores. Avoid contact with your eyes, ears, mouth, and genitals (private parts). Wash face and genitals (private parts) with your normal soap.  6. Wash thoroughly, paying special attention to the area where your surgery will be performed.  7. Thoroughly rinse your body with warm water.  8. Do not shower/wash  with your normal soap after using and rinsing off the CHG soap.  9. Pat yourself dry with a clean towel.  10. Wear clean pajamas to bed the night before surgery.  12. Place clean sheets on your bed the night of your first shower and do not sleep with pets.  13. Shower again with the CHG soap on the day of surgery prior to arriving at the hospital.  14. Do not apply any deodorants/lotions/powders.  15. Please wear clean clothes to the hospital.

## 2023-05-22 ENCOUNTER — Encounter
Admission: RE | Admit: 2023-05-22 | Discharge: 2023-05-22 | Disposition: A | Discharge status: Home / Self Care | Payer: PPO | Source: Ambulatory Visit | Attending: Surgery | Admitting: Surgery

## 2023-05-22 DIAGNOSIS — Z01818 Encounter for other preprocedural examination: Secondary | ICD-10-CM | POA: Insufficient documentation

## 2023-05-22 DIAGNOSIS — K4090 Unilateral inguinal hernia, without obstruction or gangrene, not specified as recurrent: Secondary | ICD-10-CM | POA: Diagnosis not present

## 2023-05-22 DIAGNOSIS — R9431 Abnormal electrocardiogram [ECG] [EKG]: Secondary | ICD-10-CM | POA: Insufficient documentation

## 2023-05-22 DIAGNOSIS — I10 Essential (primary) hypertension: Secondary | ICD-10-CM | POA: Insufficient documentation

## 2023-05-22 DIAGNOSIS — Z01810 Encounter for preprocedural cardiovascular examination: Secondary | ICD-10-CM

## 2023-05-22 LAB — CBC WITH DIFFERENTIAL/PLATELET
Abs Immature Granulocytes: 0.04 10*3/uL (ref 0.00–0.07)
Basophils Absolute: 0.1 10*3/uL (ref 0.0–0.1)
Basophils Relative: 1 %
Eosinophils Absolute: 0 10*3/uL (ref 0.0–0.5)
Eosinophils Relative: 0 %
HCT: 38 % — ABNORMAL LOW (ref 39.0–52.0)
Hemoglobin: 12.8 g/dL — ABNORMAL LOW (ref 13.0–17.0)
Immature Granulocytes: 0 %
Lymphocytes Relative: 18 %
Lymphs Abs: 1.9 10*3/uL (ref 0.7–4.0)
MCH: 29.6 pg (ref 26.0–34.0)
MCHC: 33.7 g/dL (ref 30.0–36.0)
MCV: 87.8 fL (ref 80.0–100.0)
Monocytes Absolute: 0.8 10*3/uL (ref 0.1–1.0)
Monocytes Relative: 8 %
Neutro Abs: 7.8 10*3/uL — ABNORMAL HIGH (ref 1.7–7.7)
Neutrophils Relative %: 73 %
Platelets: 295 10*3/uL (ref 150–400)
RBC: 4.33 MIL/uL (ref 4.22–5.81)
RDW: 12.6 % (ref 11.5–15.5)
WBC: 10.5 10*3/uL (ref 4.0–10.5)
nRBC: 0 % (ref 0.0–0.2)

## 2023-05-22 LAB — COMPREHENSIVE METABOLIC PANEL
ALT: 13 U/L (ref 0–44)
AST: 17 U/L (ref 15–41)
Albumin: 4.2 g/dL (ref 3.5–5.0)
Alkaline Phosphatase: 62 U/L (ref 38–126)
Anion gap: 12 (ref 5–15)
BUN: 23 mg/dL (ref 8–23)
CO2: 26 mmol/L (ref 22–32)
Calcium: 8.8 mg/dL — ABNORMAL LOW (ref 8.9–10.3)
Chloride: 98 mmol/L (ref 98–111)
Creatinine, Ser: 1.11 mg/dL (ref 0.61–1.24)
GFR, Estimated: >60 mL/min (ref >60)
Glucose, Bld: 115 mg/dL — ABNORMAL HIGH (ref 70–99)
Potassium: 3 mmol/L — ABNORMAL LOW (ref 3.5–5.1)
Sodium: 136 mmol/L (ref 135–145)
Total Bilirubin: 0.6 mg/dL (ref 0.0–1.2)
Total Protein: 7.7 g/dL (ref 6.5–8.1)

## 2023-05-22 NOTE — Pre-Procedure Instructions (Signed)
 Medical clearance sent to Dr Joen Laura as requested by anesthesia Joelene Millin) for abnormal EKG and elevated bp

## 2023-05-22 NOTE — Progress Notes (Signed)
 3.0 K+ sent to Dr Lajuan Lines inbasket explaining B Wallace Cullens NP is out of office this week and that any abnormal labs will need to be treated by surgeon

## 2023-05-23 ENCOUNTER — Other Ambulatory Visit: Payer: Self-pay | Admitting: Surgery

## 2023-05-23 MED ORDER — POTASSIUM CHLORIDE CRYS ER 10 MEQ PO TBCR: 10.0000 meq | EXTENDED_RELEASE_TABLET | Freq: Two times a day (BID) | ORAL | 0 refills | Status: DC

## 2023-05-23 NOTE — H&P (View-Only) (Signed)
Spoke with him and sending Rx for bid to [facility].

## 2023-05-23 NOTE — Progress Notes (Signed)
 Spoke with him and sending Rx for bid to [facility].

## 2023-05-26 NOTE — Progress Notes (Signed)
 Tried to refax clearance sheet again to pcp Joen Laura) and kept saying busy-will call office tomorrow to find out status of clearance

## 2023-05-27 NOTE — Progress Notes (Signed)
 Patient has appointment with [facility] on 05/28/23 at 10:15 am for EKG changes seen from recent 05/22/23 EKG. Surgery case was moved to last case for 05/28/23 if he gets cleared from cardiology. Patient, Dr. Lane, his office schedulers, the OR and SDS aware of these events.

## 2023-05-28 DIAGNOSIS — Z01818 Encounter for other preprocedural examination: Secondary | ICD-10-CM

## 2023-05-28 MED ORDER — CHLORHEXIDINE GLUCONATE 0.12 % MT SOLN
OROMUCOSAL | Status: AC
  Filled 2023-05-28: qty 15

## 2023-05-28 MED ORDER — ACETAMINOPHEN 500 MG PO TABS
ORAL_TABLET | ORAL | Status: AC
  Filled 2023-05-28: qty 2

## 2023-05-28 MED ORDER — GABAPENTIN 300 MG PO CAPS
ORAL_CAPSULE | ORAL | Status: AC
  Filled 2023-05-28: qty 1

## 2023-05-28 MED ORDER — CELECOXIB 200 MG PO CAPS
ORAL_CAPSULE | ORAL | Status: AC
  Filled 2023-05-28: qty 1

## 2023-05-28 MED ORDER — CEFAZOLIN SODIUM-DEXTROSE 2-4 GM/100ML-% IV SOLN
INTRAVENOUS | Status: AC
  Filled 2023-05-28: qty 100

## 2023-05-29 ENCOUNTER — Telehealth: Payer: Self-pay | Admitting: Surgery

## 2023-05-29 NOTE — Telephone Encounter (Signed)
 Patient has been advised of Pre-Admission date/time, and Surgery date at [facility].  Surgery Date: 06/11/23 Preadmission Testing Date: 06/03/23 (phone 8a-1p)  Patient has been made aware to call [phone], between 1-3:00pm the day before surgery, to find out what time to arrive for surgery.

## 2023-05-30 NOTE — Progress Notes (Signed)
 06/02/23 9:30 AM   Austin Ellis 1941-01-28 [id number]  Referring provider:  Derick Leita POUR, MD [address] [place],  [place] [address]  Urological history  Elevated PSA  -PSA (09/2022) 1.83 (3.66)  -PSA (2015) 5.4 - prostate biopsy negative -PSA (2016) 4.0 - started on finasteride  5 mg daily   2. BPH with LUTS  -cysto (10/2022) -enlarged prostate with bilobar coaptation and mildly elevated bladder neck, no median lobe, prostatic hypervascularity -finasteride  5 mg daily and tamsulosin  0.4 mg daily   3. ED  -contributing factors of age, HTN, BPH and history of smoking  4. Family history of prostate cancer  -father   HPI: Austin Ellis is a 83 y.o.male who presents today for a 1 year follow up.    Previous records reviewed.  At his visit on 04/2023, I PSS 15/4.  PVR 0 mL.  His biggest thing is his scrotal swelling.  He has found to be uncomfortable and interfere with wearing clothing.  He also states his penis is covered by the hydroceles.  He cannot tolerate the weak urinary stream.  He states it actually gets stronger as the day goes on.  Patient denies any modifying or aggravating factors.  Patient denies any recent UTI's, gross hematuria, dysuria or suprapubic/flank pain.  Patient denies any fevers, chills, nausea or vomiting.    Scrotal ultrasound (05/19/2023) -a left intratesticular simple cyst, bilateral epididymal head simple cysts, large bilateral hydroceles and a left inguinal hernia.  He is scheduled to undergo a robotic assisted left inguinal hernia repair on June 11, 2023.  We reviewed the results of his scrotal ultrasound.  He still is finding hydroceles bothersome.  He is also interested in undergoing a bladder outlet procedure in the future..  Patient denies any modifying or aggravating factors.  Patient denies any recent UTI's, gross hematuria, dysuria or suprapubic/flank pain.  Patient denies any fevers, chills, nausea or vomiting.     PMH: Past  Medical History:  Diagnosis Date   Arthritis    Benign enlargement of prostate    Chronic back pain    Elevated PSA    underwent TRUSBx on 05/28/2013 for a PSA of 5.4 ng/ml   Erectile dysfunction    Hypertension    Pre-diabetes    Urinary hesitancy     Surgical History: Past Surgical History:  Procedure Laterality Date   TONSILLECTOMY     at the age of 83    Home Medications:  Allergies as of 06/02/2023       Reactions   Cialis [tadalafil]    Back Pain        Medication List        Accurate as of June 02, 2023  9:30 AM. If you have any questions, ask your nurse or doctor.          acetaminophen  500 MG tablet Commonly known as: TYLENOL  Take 500-1,000 mg by mouth every 6 (six) hours as needed (pain.).   finasteride  5 MG tablet Commonly known as: PROSCAR  TAKE ONE TABLET BY MOUTH DAILY.   furosemide 20 MG tablet Commonly known as: LASIX Take 20 mg by mouth every evening.   Ibuprofen 200 MG Caps Take 200-400 mg by mouth every 6 (six) hours as needed (pain.).   losartan 100 MG tablet Commonly known as: COZAAR Take 100 mg by mouth at bedtime.   potassium chloride  10 MEQ tablet Commonly known as: KLOR-CON  M Take 1 tablet (10 mEq total) by mouth 2 (two) times daily.  tamsulosin  0.4 MG Caps capsule Commonly known as: FLOMAX  TAKE (2) CAPSULES BY MOUTH EVERY DAY What changed: See the new instructions.   VITAMIN B 12 PO Take 1,000 mg by mouth daily.        Allergies:  Allergies  Allergen Reactions   Cialis [Tadalafil]     Back Pain    Family History: Family History  Problem Relation Age of Onset   Prostate cancer Father    Lung cancer Father    Kidney disease Neg Hx    Bladder Cancer Neg Hx    Kidney cancer Neg Hx     Social History:  reports that he quit smoking about 29 years ago. His smoking use included cigarettes. He started smoking about 60 years ago. He has a 7.8 pack-year smoking history. He has never used smokeless tobacco. He  reports that he does not drink alcohol  and does not use drugs.   Physical Exam: BP (!) 165/64 (BP Location: Left Arm, Patient Position: Sitting, Cuff Size: Normal)   Pulse 79   SpO2 97%   Constitutional:  Well nourished. Alert and oriented, No acute distress. HEENT: [place] U AT, moist mucus membranes.  Trachea midline Cardiovascular: No clubbing, cyanosis, or edema. Respiratory: Normal respiratory effort, no increased work of breathing. Neurologic: Grossly intact, no focal deficits, moving all 4 extremities. Psychiatric: Normal mood and affect.   Laboratory Data: Results for orders placed or performed during the hospital encounter of 05/22/23  CBC with Differential/Platelet   Collection Time: 05/22/23 11:34 AM  Result Value Ref Range   WBC 10.5 4.0 - 10.5 K/uL   RBC 4.33 4.22 - 5.81 MIL/uL   Hemoglobin 12.8 (L) 13.0 - 17.0 g/dL   HCT 61.9 (L) 60.9 - 47.9 %   MCV 87.8 80.0 - 100.0 fL   MCH 29.6 26.0 - 34.0 pg   MCHC 33.7 30.0 - 36.0 g/dL   RDW 87.3 88.4 - 84.4 %   Platelets 295 150 - 400 K/uL   nRBC 0.0 0.0 - 0.2 %   Neutrophils Relative % 73 %   Neutro Abs 7.8 (H) 1.7 - 7.7 K/uL   Lymphocytes Relative 18 %   Lymphs Abs 1.9 0.7 - 4.0 K/uL   Monocytes Relative 8 %   Monocytes Absolute 0.8 0.1 - 1.0 K/uL   Eosinophils Relative 0 %   Eosinophils Absolute 0.0 0.0 - 0.5 K/uL   Basophils Relative 1 %   Basophils Absolute 0.1 0.0 - 0.1 K/uL   Immature Granulocytes 0 %   Abs Immature Granulocytes 0.04 0.00 - 0.07 K/uL  Comprehensive metabolic panel   Collection Time: 05/22/23 11:34 AM  Result Value Ref Range   Sodium 136 135 - 145 mmol/L   Potassium 3.0 (L) 3.5 - 5.1 mmol/L   Chloride 98 98 - 111 mmol/L   CO2 26 22 - 32 mmol/L   Glucose, Bld 115 (H) 70 - 99 mg/dL   BUN 23 8 - 23 mg/dL   Creatinine, Ser 8.88 0.61 - 1.24 mg/dL   Calcium 8.8 (L) 8.9 - 10.3 mg/dL   Total Protein 7.7 6.5 - 8.1 g/dL   Albumin 4.2 3.5 - 5.0 g/dL   AST 17 15 - 41 U/L   ALT 13 0 - 44 U/L   Alkaline  Phosphatase 62 38 - 126 U/L   Total Bilirubin 0.6 0.0 - 1.2 mg/dL   GFR, Estimated >39 >39 mL/min   Anion gap 12 5 - 15  I have reviewed the labs.  See  HPI.     Pertinent Imaging : PROCEDURE: ULTRASOUND SCROTUM DOPPLER COMPLETE   HISTORY: Patient is a 83 y/o M with bilateral scrotal swelling x three years. Known left inguinal hernia.   COMPARISON: None.   TECHNIQUE: Two-dimensional grayscale, color and spectral Doppler ultrasound of the scrotum and testicles was performed.   FINDINGS: The right testicle measures 4.0 x 2.3 x 3.2 cm, demonstrates a normal echotexture with normal color and spectral Doppler flow. There are no intratesticular masses or calcifications. The right epididymal head measures 1.3 cm and demonstrates a normal echotexture with a 0.5 cm anechoic cyst.   The left testicle measures 4.5 x 2.7 x 2.7 cm, demonstrates a normal echotexture and a 0.6 cm anechoic posterior mid intratesticular cyst. There is normal color and spectral Doppler flow. There are no intratesticular masses or calcifications. The left epididymal head measures 1.0 cm and demonstrates a normal echotexture with a 0.9 cm anechoic cyst.   There are large bilateral hydroceles. No varicoceles are identified.   A left inguinal hernia is present.   IMPRESSION: 1. Subcentimeter left intratesticular simple cyst.   2.  Bilateral subcentimeter epididymal head simple cysts.   3.  Large bilateral hydroceles.   4.  Left inguinal hernia.   Thank you for allowing us  to assist in the care of this patient.     Electronically Signed   By: Lynwood Mains M.D.   On: 05/20/2023 07:26 I have independently reviewed the films.  See HPI.     Assessment & Plan:    1.  Bilateral hydroceles -He will return in 6 weeks after his hernia surgery to revisit the possibility of undergoing a hydrocelectomy if appropriate  2.  Left inguinal hernia -Scheduled for robotic left inguinal hernia repair on January  22  3. BPH with LUTS -most bothersome symptoms is weak urinary stream  -continue conservative management, avoiding bladder irritants and timed voiding's -Continue tamsulosin  0.4 mg daily and finasteride  5 mg daily -He is interested in a bladder outlet procedure in the future as well  4.  Erectile dysfunction -He is no longer sexually active  Follow-up the first or second week of March 2 revisit possible hydrocelectomy and/or a bladder outlet procedure   CLOTILDA CORNWALL, PA-C   [facility] [address], [address] [place], [place] [address] [phone]

## 2023-06-02 ENCOUNTER — Ambulatory Visit: Payer: PPO | Admitting: Urology

## 2023-06-02 ENCOUNTER — Encounter: Payer: Self-pay | Admitting: Urology

## 2023-06-02 VITALS — BP 165/64 | HR 79

## 2023-06-02 DIAGNOSIS — R3912 Poor urinary stream: Secondary | ICD-10-CM

## 2023-06-02 DIAGNOSIS — N401 Enlarged prostate with lower urinary tract symptoms: Secondary | ICD-10-CM | POA: Diagnosis not present

## 2023-06-02 DIAGNOSIS — N433 Hydrocele, unspecified: Secondary | ICD-10-CM | POA: Diagnosis not present

## 2023-06-02 DIAGNOSIS — K4090 Unilateral inguinal hernia, without obstruction or gangrene, not specified as recurrent: Secondary | ICD-10-CM

## 2023-06-03 ENCOUNTER — Other Ambulatory Visit: Payer: Self-pay

## 2023-06-03 ENCOUNTER — Encounter
Admission: RE | Admit: 2023-06-03 | Discharge: 2023-06-03 | Disposition: A | Discharge status: Home / Self Care | Payer: PPO | Source: Ambulatory Visit | Attending: Surgery | Admitting: Surgery

## 2023-06-03 NOTE — Patient Instructions (Signed)
 Your procedure is scheduled on: Wednesday 06/11/23 To find out your arrival time, please call [phone] between 1PM - 3PM on:  Tuesday 06/10/23  Report to the Registration Desk on the 1st floor of the Medical [facility]. FREE Valet parking is available.  If your arrival time is 6:00 am, do not arrive before that time as the Medical Mall entrance doors do not open until 6:00 am.  REMEMBER: Instructions that are not followed completely may result in serious medical risk, up to and including death; or upon the discretion of your surgeon and anesthesiologist your surgery may need to be rescheduled.  Do not eat food or drink any liquids after midnight the night before surgery.  No gum chewing or hard candies.  One week prior to surgery: Stop Anti-inflammatories (NSAIDS) such as Advil, Aleve, Ibuprofen, Motrin, Naproxen, Naprosyn and Aspirin based products such as Excedrin, Goody's Powder, BC Powder. You may however, continue to take Tylenol  if needed for pain up until the day of surgery.  Stop ANY OVER THE COUNTER supplements and vitamins until after surgery.  Continue taking all prescribed medications.   TAKE ONLY THESE MEDICATIONS THE MORNING OF SURGERY WITH A SIP OF WATER:  finasteride  (PROSCAR ) 5 MG tablet  tamsulosin  (FLOMAX ) 0.4 MG CAPS capsule   No Alcohol  for 24 hours before or after surgery.  No Smoking including e-cigarettes for 24 hours before surgery.  No chewable tobacco products for at least 6 hours before surgery.  No nicotine patches on the day of surgery.  Do not use any recreational drugs for at least a week (preferably 2 weeks) before your surgery.  Please be advised that the combination of cocaine and anesthesia may have negative outcomes, up to and including death. If you test positive for cocaine, your surgery will be cancelled.  On the morning of surgery brush your teeth with toothpaste and water, you may rinse your mouth with mouthwash if you wish. Do not  swallow any toothpaste or mouthwash.  Use CHG Soap or wipes as directed on instruction sheet.  Do not wear lotions, powders, or perfumes.   Do not shave body hair from the neck down 48 hours before surgery.  Wear clean comfortable clothing (specific to your surgery type) to the hospital.  Do not wear jewelry, make-up, hairpins, clips or nail polish.  For welded (permanent) jewelry: bracelets, anklets, waist bands, etc.  Please have this removed prior to surgery.  If it is not removed, there is a chance that hospital personnel will need to cut it off on the day of surgery. Contact lenses, hearing aids and dentures may not be worn into surgery.  Do not bring valuables to the hospital. [facility] is not responsible for any missing/lost belongings or valuables.   Notify your doctor if there is any change in your medical condition (cold, fever, infection).  If you are being discharged the day of surgery, you will not be allowed to drive home. You will need a responsible individual to drive you home and stay with you for 24 hours after surgery.   If you are taking public transportation, you will need to have a responsible individual with you.  If you are being admitted to the hospital overnight, leave your suitcase in the car. After surgery it may be brought to your room.  In case of increased patient census, it may be necessary for you, the patient, to continue your postoperative care in the Same Day Surgery department.  After surgery, you can help  prevent lung complications by doing breathing exercises.  Take deep breaths and cough every 1-2 hours. Your doctor may order a device called an Incentive Spirometer to help you take deep breaths. When coughing or sneezing, hold a pillow firmly against your incision with both hands. This is called "splinting." Doing this helps protect your incision. It also decreases belly discomfort.  Surgery Visitation Policy:  Patients undergoing a surgery  or procedure may have two family members or support persons with them as long as the person is not COVID-19 positive or experiencing its symptoms.   Inpatient Visitation:    Visiting hours are 7 a.m. to 8 p.m. Up to four visitors are allowed at one time in a patient room. The visitors may rotate out with other people during the day. One designated support person (adult) may remain overnight.  Due to an increase in RSV and influenza rates and associated hospitalizations, children ages 45 and under will not be able to visit patients in [facility]. Masks continue to be strongly recommended.  Please call the Pre-admissions Testing Dept. at [phone] if you have any questions about these instructions.     Preparing for Surgery with CHLORHEXIDINE  GLUCONATE (CHG) Soap  Chlorhexidine  Gluconate (CHG) Soap  o An antiseptic cleaner that kills germs and bonds with the skin to continue killing germs even after washing  o Used for showering the night before surgery and morning of surgery  Before surgery, you can play an important role by reducing the number of germs on your skin.  CHG (Chlorhexidine  gluconate) soap is an antiseptic cleanser which kills germs and bonds with the skin to continue killing germs even after washing.  Please do not use if you have an allergy to CHG or antibacterial soaps. If your skin becomes reddened/irritated stop using the CHG.  1. Shower the NIGHT BEFORE SURGERY and the MORNING OF SURGERY with CHG soap.  2. If you choose to wash your hair, wash your hair first as usual with your normal shampoo.  3. After shampooing, rinse your hair and body thoroughly to remove the shampoo.  4. Use CHG as you would any other liquid soap. You can apply CHG directly to the skin and wash gently with a scrungie or a clean washcloth.  5. Apply the CHG soap to your body only from the neck down. Do not use on open wounds or open sores. Avoid contact with your eyes, ears,  mouth, and genitals (private parts). Wash face and genitals (private parts) with your normal soap.  6. Wash thoroughly, paying special attention to the area where your surgery will be performed.  7. Thoroughly rinse your body with warm water.  8. Do not shower/wash with your normal soap after using and rinsing off the CHG soap.  9. Pat yourself dry with a clean towel.  10. Wear clean pajamas to bed the night before surgery.  12. Place clean sheets on your bed the night of your first shower and do not sleep with pets.  13. Shower again with the CHG soap on the day of surgery prior to arriving at the hospital.  14. Do not apply any deodorants/lotions/powders.  15. Please wear clean clothes to the hospital.

## 2023-06-03 NOTE — Pre-Procedure Instructions (Signed)
 Pre op phone interview with patient. Chart reviewed. Medical/surgical history, medications, psychosocial, ADL's, and discharge plan reviewed with patient. Verbal and written instructions thru MyChart given. Patient verbalized understanding. Aware to call with any questions/concerns. Recheck potassium level Friday 06/06/23 at 10:00 am. Copy of instructions and new bottle of CHG soap will be given at that time.

## 2023-06-05 ENCOUNTER — Encounter: Payer: Self-pay | Admitting: Urgent Care

## 2023-06-06 ENCOUNTER — Inpatient Hospital Stay: Admission: RE | Admit: 2023-06-06 | Payer: PPO | Source: Ambulatory Visit

## 2023-06-11 ENCOUNTER — Other Ambulatory Visit: Payer: Self-pay

## 2023-06-11 ENCOUNTER — Ambulatory Visit: Payer: PPO | Admitting: Urgent Care

## 2023-06-11 ENCOUNTER — Encounter: Payer: Self-pay | Admitting: Surgery

## 2023-06-11 ENCOUNTER — Ambulatory Visit
Admission: RE | Admit: 2023-06-11 | Discharge: 2023-06-11 | Disposition: A | Discharge status: Home / Self Care | Payer: PPO | Attending: Surgery | Admitting: Surgery

## 2023-06-11 ENCOUNTER — Encounter
Admission: RE | Disposition: A | Discharge status: Home / Self Care | Payer: Self-pay | Source: Home / Self Care | Attending: Surgery

## 2023-06-11 DIAGNOSIS — Z01818 Encounter for other preprocedural examination: Secondary | ICD-10-CM

## 2023-06-11 DIAGNOSIS — E876 Hypokalemia: Secondary | ICD-10-CM

## 2023-06-11 DIAGNOSIS — K4090 Unilateral inguinal hernia, without obstruction or gangrene, not specified as recurrent: Secondary | ICD-10-CM | POA: Insufficient documentation

## 2023-06-11 DIAGNOSIS — Z01812 Encounter for preprocedural laboratory examination: Secondary | ICD-10-CM

## 2023-06-11 HISTORY — PX: INSERTION OF MESH: SHX5868

## 2023-06-11 LAB — POCT I-STAT, CHEM 8
BUN: 30 mg/dL — ABNORMAL HIGH (ref 8–23)
Calcium, Ion: 1.13 mmol/L — ABNORMAL LOW (ref 1.15–1.40)
Chloride: 107 mmol/L (ref 98–111)
Creatinine, Ser: 1.3 mg/dL — ABNORMAL HIGH (ref 0.61–1.24)
Glucose, Bld: 110 mg/dL — ABNORMAL HIGH (ref 70–99)
HCT: 41 % (ref 39.0–52.0)
Hemoglobin: 13.9 g/dL (ref 13.0–17.0)
Potassium: 3.9 mmol/L (ref 3.5–5.1)
Sodium: 139 mmol/L (ref 135–145)
TCO2: 21 mmol/L — ABNORMAL LOW (ref 22–32)

## 2023-06-11 SURGERY — HERNIORRHAPHY, INGUINAL, ROBOT-ASSISTED, LAPAROSCOPIC
Anesthesia: General | Laterality: Left

## 2023-06-11 MED ORDER — FENTANYL CITRATE (PF) 100 MCG/2ML IJ SOLN
25.0000 ug | INTRAMUSCULAR | Status: DC | PRN
  Administered 2023-06-11: 25 ug via INTRAVENOUS

## 2023-06-11 MED ORDER — BUPIVACAINE-EPINEPHRINE (PF) 0.25% -1:200000 IJ SOLN
INTRAMUSCULAR | Status: AC
  Filled 2023-06-11: qty 30

## 2023-06-11 MED ORDER — GABAPENTIN 300 MG PO CAPS
300.0000 mg | ORAL_CAPSULE | ORAL | Status: AC
  Administered 2023-06-11: 300 mg via ORAL

## 2023-06-11 MED ORDER — ACETAMINOPHEN 10 MG/ML IV SOLN: 1000.0000 mg | Freq: Once | INTRAVENOUS | Status: DC | PRN

## 2023-06-11 MED ORDER — CEFAZOLIN SODIUM-DEXTROSE 2-4 GM/100ML-% IV SOLN
2.0000 g | INTRAVENOUS | Status: AC
  Administered 2023-06-11: 2 g via INTRAVENOUS

## 2023-06-11 MED ORDER — DEXAMETHASONE SODIUM PHOSPHATE 10 MG/ML IJ SOLN
INTRAMUSCULAR | Status: DC | PRN
  Administered 2023-06-11: 10 mg via INTRAVENOUS

## 2023-06-11 MED ORDER — CHLORHEXIDINE GLUCONATE CLOTH 2 % EX PADS
6.0000 | MEDICATED_PAD | Freq: Once | CUTANEOUS | Status: AC
  Administered 2023-06-11: 6 via TOPICAL

## 2023-06-11 MED ORDER — 0.9 % SODIUM CHLORIDE (POUR BTL) OPTIME
TOPICAL | Status: DC | PRN
  Administered 2023-06-11: 500 mL

## 2023-06-11 MED ORDER — LIDOCAINE HCL (PF) 2 % IJ SOLN
INTRAMUSCULAR | Status: AC
  Filled 2023-06-11: qty 5

## 2023-06-11 MED ORDER — GABAPENTIN 300 MG PO CAPS
ORAL_CAPSULE | ORAL | Status: AC
  Filled 2023-06-11: qty 1

## 2023-06-11 MED ORDER — KETOROLAC TROMETHAMINE 30 MG/ML IJ SOLN
INTRAMUSCULAR | Status: AC
  Filled 2023-06-11: qty 1

## 2023-06-11 MED ORDER — OXYCODONE HCL 5 MG PO TABS
ORAL_TABLET | ORAL | Status: AC
  Filled 2023-06-11: qty 1

## 2023-06-11 MED ORDER — CELECOXIB 200 MG PO CAPS
ORAL_CAPSULE | ORAL | Status: AC
  Filled 2023-06-11: qty 1

## 2023-06-11 MED ORDER — DROPERIDOL 2.5 MG/ML IJ SOLN: 0.6250 mg | Freq: Once | INTRAMUSCULAR | Status: DC | PRN

## 2023-06-11 MED ORDER — DEXAMETHASONE SODIUM PHOSPHATE 10 MG/ML IJ SOLN
INTRAMUSCULAR | Status: AC
  Filled 2023-06-11: qty 1

## 2023-06-11 MED ORDER — BUPIVACAINE LIPOSOME 1.3 % IJ SUSP
INTRAMUSCULAR | Status: DC | PRN
  Administered 2023-06-11: 10 mL

## 2023-06-11 MED ORDER — EPHEDRINE 5 MG/ML INJ
INTRAVENOUS | Status: AC
  Filled 2023-06-11: qty 5

## 2023-06-11 MED ORDER — VITAMIN B 12 500 MCG PO TABS
1000.0000 ug | ORAL_TABLET | Freq: Every day | ORAL | Status: AC
Start: 2023-06-11 — End: ?

## 2023-06-11 MED ORDER — ONDANSETRON HCL 4 MG/2ML IJ SOLN
INTRAMUSCULAR | Status: DC | PRN
  Administered 2023-06-11: 4 mg via INTRAVENOUS

## 2023-06-11 MED ORDER — CEFAZOLIN SODIUM-DEXTROSE 2-4 GM/100ML-% IV SOLN
INTRAVENOUS | Status: AC
  Filled 2023-06-11: qty 100

## 2023-06-11 MED ORDER — CHLORHEXIDINE GLUCONATE 0.12 % MT SOLN
15.0000 mL | Freq: Once | OROMUCOSAL | Status: AC
  Administered 2023-06-11: 15 mL via OROMUCOSAL

## 2023-06-11 MED ORDER — ORAL CARE MOUTH RINSE: 15.0000 mL | Freq: Once | OROMUCOSAL | Status: AC

## 2023-06-11 MED ORDER — BUPIVACAINE LIPOSOME 1.3 % IJ SUSP
INTRAMUSCULAR | Status: AC
  Filled 2023-06-11: qty 10

## 2023-06-11 MED ORDER — PHENYLEPHRINE 80 MCG/ML (10ML) SYRINGE FOR IV PUSH (FOR BLOOD PRESSURE SUPPORT)
PREFILLED_SYRINGE | INTRAVENOUS | Status: DC | PRN
  Administered 2023-06-11 (×2): 80 ug via INTRAVENOUS
  Administered 2023-06-11 (×4): 160 ug via INTRAVENOUS
  Administered 2023-06-11: 80 ug via INTRAVENOUS

## 2023-06-11 MED ORDER — LIDOCAINE HCL (CARDIAC) PF 100 MG/5ML IV SOSY
PREFILLED_SYRINGE | INTRAVENOUS | Status: DC | PRN
  Administered 2023-06-11: 100 mg via INTRAVENOUS

## 2023-06-11 MED ORDER — PROPOFOL 10 MG/ML IV BOLUS
INTRAVENOUS | Status: DC | PRN
  Administered 2023-06-11: 150 mg via INTRAVENOUS
  Administered 2023-06-11: 100 ug/kg/min via INTRAVENOUS

## 2023-06-11 MED ORDER — LACTATED RINGERS IV SOLN: INTRAVENOUS | Status: DC

## 2023-06-11 MED ORDER — OXYCODONE HCL 5 MG/5ML PO SOLN: 5.0000 mg | Freq: Once | ORAL | Status: AC | PRN

## 2023-06-11 MED ORDER — FENTANYL CITRATE (PF) 100 MCG/2ML IJ SOLN
INTRAMUSCULAR | Status: DC | PRN
  Administered 2023-06-11 (×2): 50 ug via INTRAVENOUS

## 2023-06-11 MED ORDER — CHLORHEXIDINE GLUCONATE 0.12 % MT SOLN
OROMUCOSAL | Status: AC
  Filled 2023-06-11: qty 15

## 2023-06-11 MED ORDER — EPHEDRINE SULFATE-NACL 50-0.9 MG/10ML-% IV SOSY
PREFILLED_SYRINGE | INTRAVENOUS | Status: DC | PRN
  Administered 2023-06-11: 10 mg via INTRAVENOUS
  Administered 2023-06-11: 5 mg via INTRAVENOUS
  Administered 2023-06-11: 10 mg via INTRAVENOUS

## 2023-06-11 MED ORDER — OXYCODONE HCL 5 MG PO TABS
5.0000 mg | ORAL_TABLET | Freq: Once | ORAL | Status: AC | PRN
  Administered 2023-06-11: 5 mg via ORAL

## 2023-06-11 MED ORDER — ONDANSETRON HCL 4 MG/2ML IJ SOLN
INTRAMUSCULAR | Status: AC
  Filled 2023-06-11: qty 2

## 2023-06-11 MED ORDER — FENTANYL CITRATE (PF) 100 MCG/2ML IJ SOLN
INTRAMUSCULAR | Status: AC
  Filled 2023-06-11: qty 2

## 2023-06-11 MED ORDER — ACETAMINOPHEN 500 MG PO TABS
ORAL_TABLET | ORAL | Status: AC
  Filled 2023-06-11: qty 2

## 2023-06-11 MED ORDER — CELECOXIB 200 MG PO CAPS
200.0000 mg | ORAL_CAPSULE | ORAL | Status: AC
  Administered 2023-06-11: 200 mg via ORAL

## 2023-06-11 MED ORDER — ROCURONIUM BROMIDE 100 MG/10ML IV SOLN
INTRAVENOUS | Status: DC | PRN
  Administered 2023-06-11 (×2): 50 mg via INTRAVENOUS

## 2023-06-11 MED ORDER — BUPIVACAINE-EPINEPHRINE (PF) 0.25% -1:200000 IJ SOLN
INTRAMUSCULAR | Status: DC | PRN
  Administered 2023-06-11: 30 mL via PERINEURAL

## 2023-06-11 MED ORDER — SEVOFLURANE IN SOLN
RESPIRATORY_TRACT | Status: AC
  Filled 2023-06-11: qty 250

## 2023-06-11 MED ORDER — SUGAMMADEX SODIUM 200 MG/2ML IV SOLN
INTRAVENOUS | Status: DC | PRN
  Administered 2023-06-11: 200 mg via INTRAVENOUS

## 2023-06-11 MED ORDER — HYDROCODONE-ACETAMINOPHEN 5-325 MG PO TABS: 1.0000 | ORAL_TABLET | Freq: Four times a day (QID) | ORAL | 0 refills | Status: DC | PRN

## 2023-06-11 MED ORDER — PROPOFOL 1000 MG/100ML IV EMUL
INTRAVENOUS | Status: AC
  Filled 2023-06-11: qty 100

## 2023-06-11 MED ORDER — ACETAMINOPHEN 500 MG PO TABS: 1000.0000 mg | ORAL_TABLET | ORAL | Status: AC

## 2023-06-11 MED ORDER — ROCURONIUM BROMIDE 10 MG/ML (PF) SYRINGE
PREFILLED_SYRINGE | INTRAVENOUS | Status: AC
  Filled 2023-06-11: qty 10

## 2023-06-11 MED ORDER — BUPIVACAINE LIPOSOME 1.3 % IJ SUSP: 20.0000 mL | Freq: Once | INTRAMUSCULAR | Status: DC

## 2023-06-11 MED ORDER — PHENYLEPHRINE 80 MCG/ML (10ML) SYRINGE FOR IV PUSH (FOR BLOOD PRESSURE SUPPORT)
PREFILLED_SYRINGE | INTRAVENOUS | Status: AC
  Filled 2023-06-11: qty 10

## 2023-06-11 SURGICAL SUPPLY — 37 items
COVER TIP SHEARS 8 DVNC (MISCELLANEOUS) ×1 IMPLANT
COVER WAND RF STERILE (DRAPES) ×1 IMPLANT
DERMABOND ADVANCED .7 DNX12 (GAUZE/BANDAGES/DRESSINGS) ×1 IMPLANT
DRAPE ARM DVNC X/XI (DISPOSABLE) ×3 IMPLANT
DRAPE COLUMN DVNC XI (DISPOSABLE) ×1 IMPLANT
DRAPE UTILITY 15X26 TOWEL STRL (DRAPES) ×1 IMPLANT
ELECT REM PT RETURN 9FT ADLT (ELECTROSURGICAL) ×1
ELECTRODE REM PT RTRN 9FT ADLT (ELECTROSURGICAL) ×1 IMPLANT
FORCEPS BPLR R/ABLATION 8 DVNC (INSTRUMENTS) ×1 IMPLANT
GLOVE ORTHO TXT STRL SZ7.5 (GLOVE) ×3 IMPLANT
GOWN STRL REUS W/ TWL LRG LVL3 (GOWN DISPOSABLE) ×1 IMPLANT
GOWN STRL REUS W/ TWL XL LVL3 (GOWN DISPOSABLE) ×2 IMPLANT
GRASPER SUT TROCAR 14GX15 (MISCELLANEOUS) IMPLANT
KIT PINK PAD W/HEAD ARE REST (MISCELLANEOUS) ×1
KIT PINK PAD W/HEAD ARM REST (MISCELLANEOUS) ×1 IMPLANT
LABEL OR SOLS (LABEL) ×1 IMPLANT
MESH 3DMAX LIGHT 4.8X6.7 LT XL (Mesh General) IMPLANT
NDL DRIVE SUT CUT DVNC (INSTRUMENTS) ×1 IMPLANT
NDL HYPO 22X1.5 SAFETY MO (MISCELLANEOUS) ×1 IMPLANT
NDL INSUFFLATION 14GA 120MM (NEEDLE) IMPLANT
NEEDLE DRIVE SUT CUT DVNC (INSTRUMENTS) ×1
NEEDLE HYPO 22X1.5 SAFETY MO (MISCELLANEOUS) ×1
NEEDLE INSUFFLATION 14GA 120MM (NEEDLE) ×1
PACK LAP CHOLECYSTECTOMY (MISCELLANEOUS) ×1 IMPLANT
SCISSORS MNPLR CVD DVNC XI (INSTRUMENTS) ×1 IMPLANT
SEAL UNIV 5-12 XI (MISCELLANEOUS) ×3 IMPLANT
SET TUBE SMOKE EVAC HIGH FLOW (TUBING) IMPLANT
SOL ELECTROSURG ANTI STICK (MISCELLANEOUS) ×1
SOLUTION ELECTROSURG ANTI STCK (MISCELLANEOUS) ×1 IMPLANT
SUT MNCRL 4-0 27 PS-2 XMFL (SUTURE) ×1
SUT MNCRL 4-0 27XMFL (SUTURE) ×1
SUT STRATA 2-0 23CM CT-2 (SUTURE) IMPLANT
SUT VIC AB 2-0 SH 27XBRD (SUTURE) ×1 IMPLANT
SUT VICRYL 0 UR6 27IN ABS (SUTURE) IMPLANT
SUTURE MNCRL 4-0 27XMF (SUTURE) ×1 IMPLANT
TRAP FLUID SMOKE EVACUATOR (MISCELLANEOUS) ×1 IMPLANT
WATER STERILE IRR 500ML POUR (IV SOLUTION) ×1 IMPLANT

## 2023-06-11 NOTE — Anesthesia Postprocedure Evaluation (Signed)
Anesthesia Post Note  Patient: Elvon Mcculloh  Procedure(s) Performed: XI ROBOTIC ASSISTED INGUINAL HERNIA (Left) INSERTION OF MESH (Left)  Patient location during evaluation: PACU Anesthesia Type: General Level of consciousness: awake and alert Pain management: pain level controlled Vital Signs Assessment: post-procedure vital signs reviewed and stable Respiratory status: spontaneous breathing, nonlabored ventilation, respiratory function stable and patient connected to nasal cannula oxygen Cardiovascular status: blood pressure returned to baseline and stable Postop Assessment: no apparent nausea or vomiting Anesthetic complications: no   There were no known notable events for this encounter.   Last Vitals:  Vitals:   06/11/23 1000 06/11/23 1018  BP: 139/69 (!) 149/76  Pulse: 81 86  Resp: 19 18  Temp: (!) 36.3 C 36.7 C  SpO2: 94% 96%    Last Pain:  Vitals:   06/11/23 1018  TempSrc: Oral  PainSc: 3                  Yevette Edwards

## 2023-06-11 NOTE — Transfer of Care (Signed)
Immediate Anesthesia Transfer of Care Note  Patient: Austin Ellis  Procedure(s) Performed: XI ROBOTIC ASSISTED INGUINAL HERNIA (Left) INSERTION OF MESH (Left)  Patient Location: PACU  Anesthesia Type:General  Level of Consciousness: sedated  Airway & Oxygen Therapy: Patient Spontanous Breathing  Post-op Assessment: Report given to RN and Post -op Vital signs reviewed and stable  Post vital signs: Reviewed and stable  Last Vitals:  Vitals Value Taken Time  BP 125/59 06/11/23 0917  Temp    Pulse 80 06/11/23 0920  Resp 25 06/11/23 0920  SpO2 98 % 06/11/23 0920  Vitals shown include unfiled device data.  Last Pain:  Vitals:   06/11/23 0633  TempSrc:   PainSc: 4       Patients Stated Pain Goal: 0 (06/11/23 6045)  Complications: There were no known notable events for this encounter.

## 2023-06-11 NOTE — Anesthesia Procedure Notes (Signed)
Procedure Name: Intubation Date/Time: 06/11/2023 7:38 AM  Performed by: Lysbeth Penner, CRNAPre-anesthesia Checklist: Patient identified, Emergency Drugs available, Suction available and Patient being monitored Patient Re-evaluated:Patient Re-evaluated prior to induction Oxygen Delivery Method: Circle system utilized Preoxygenation: Pre-oxygenation with 100% oxygen Induction Type: IV induction Ventilation: Mask ventilation without difficulty Laryngoscope Size: McGrath and 4 Tube type: Oral Tube size: 7.0 mm Number of attempts: 1 Airway Equipment and Method: Stylet and Oral airway Placement Confirmation: ETT inserted through vocal cords under direct vision, positive ETCO2 and breath sounds checked- equal and bilateral Secured at: 22 cm Tube secured with: Tape Dental Injury: Teeth and Oropharynx as per pre-operative assessment

## 2023-06-11 NOTE — Interval H&P Note (Signed)
History and Physical Interval Note:  06/11/2023 7:23 AM  Austin Ellis  has presented today for surgery, with the diagnosis of inguinal hernia.  The various methods of treatment have been discussed with the patient and family. After consideration of risks, benefits and other options for treatment, the patient has consented to  Procedure(s): XI ROBOTIC ASSISTED INGUINAL HERNIA (Left) as a surgical intervention.  The patient's history has been reviewed, patient examined, no change in status, stable for surgery.  I have reviewed the patient's chart and labs.  Questions were answered to the patient's satisfaction.    The left side is marked.   Campbell Lerner

## 2023-06-11 NOTE — Op Note (Signed)
Robotic assisted Laparoscopic Transabdominal Left Inguinal Hernia Repair with Mesh       Pre-operative Diagnosis:  Left Inguinal Hernia   Post-operative Diagnosis: Same   Procedure: Robotic assisted Laparoscopic  repair of left inguinal hernia(s)   Surgeon: Campbell Lerner, M.D., FACS   Anesthesia: GETA   Findings: large left indirect inguinal hernia, no evidence of right sided hernia.         Procedure Details  The patient was seen again in the Holding Room. The benefits, complications, treatment options, and expected outcomes were discussed with the patient. The risks of bleeding, infection, recurrence of symptoms, failure to resolve symptoms, recurrence of hernia, ischemic orchitis, chronic pain syndrome or neuroma, were reviewed again. The likelihood of improving the patient's symptoms with return to their baseline status is good.  The patient and/or family concurred with the proposed plan, giving informed consent.  The patient was taken to Operating Room, identified  and the procedure verified as Laparoscopic Inguinal Hernia Repair. Laterality confirmed.  A Time Out was held and the above information confirmed.   Prior to the induction of general anesthesia, antibiotic prophylaxis was administered. VTE prophylaxis was in place. General endotracheal anesthesia was then administered and tolerated well. After the induction, the abdomen was prepped with Chloraprep and draped in the sterile fashion. The patient was positioned in the supine position.   After local infiltration of quarter percent Marcaine with epinephrine, stab incision was made left upper quadrant.  On the left at Palmer's point, the Veress needle is passed with sensation of the layers to penetrate the abdominal wall and into the peritoneum.  Saline drop test is confirmed peritoneal placement.  Insufflation is initiated with carbon dioxide to pressures of 15 mmHg. An 8.5 mm port is placed to the left off of the midline, with  blunt tipped trocar.  Pneumoperitoneum maintained w/o HD changes to pressures of 15 mm Hg with CO2. No evidence of bowel injuries.  Two 8.5 mm ports placed under direct vision in each upper quadrant. The laparoscopy revealed a large left sided indirect defect(s).   The robot was brought to the table and docked in the standard fashion, no collision between arms was observed. Instruments were kept under direct view at all times. For left inguinal hernia repair,  I developed a peritoneal flap. The sac(s) were reduced and dissected free from adjacent structures. We preserved the vas and the vessels, and visualized them to their convergence and beyond in the retroperitoneum. Once dissection was completed a left sided extra large  BARD 3D Light mesh was placed and secured at three points with interrupted 2-0 Vicryl to the pubic tubercle and anteriorly. There was good coverage of the direct, indirect and femoral spaces.  Second look revealed no complications or injuries.  The flap was then closed with 3-0 V-lock suture.  Peritoneal closure without defects.  Once assuring that hemostasis was adequate, all needles/sponges removed, and the robot was undocked.  Under direct visualization I placed the Veress needle into the preperitoneal space the Veress' valve was released allowing extraperitoneal CO2 to escape, it was also used to access the space for supplemental local anesthesia. The ports were removed, the abdomen desulflated.  4-0 subcuticular Monocryl was used at all skin edges. Dermabond was placed.  Patient tolerated the procedure well. There were no complications. He was taken to the recovery room in stable condition.           Campbell Lerner, M.D., FACS 06/11/2023, 9:29 AM

## 2023-06-11 NOTE — Anesthesia Preprocedure Evaluation (Signed)
Anesthesia Evaluation  Patient identified by MRN, date of birth, ID band Patient awake    Reviewed: Allergy & Precautions, H&P , NPO status , Patient's Chart, lab work & pertinent test results, reviewed documented beta blocker date and time   Airway Mallampati: III  TM Distance: >3 FB Neck ROM: full    Dental  (+) Missing, Poor Dentition   Pulmonary neg pulmonary ROS, former smoker   Pulmonary exam normal        Cardiovascular Exercise Tolerance: Poor hypertension, On Medications negative cardio ROS Normal cardiovascular exam Rhythm:regular Rate:Normal  Cards clearance.   Neuro/Psych negative neurological ROS  negative psych ROS   GI/Hepatic negative GI ROS, Neg liver ROS,,,  Endo/Other  negative endocrine ROS    Renal/GU negative Renal ROS  negative genitourinary   Musculoskeletal   Abdominal   Peds  Hematology negative hematology ROS (+)   Anesthesia Other Findings Past Medical History: No date: Arthritis No date: Benign enlargement of prostate No date: Chronic back pain No date: Elevated PSA     Comment:  underwent TRUSBx on 05/28/2013 for a PSA of 5.4 ng/ml No date: Erectile dysfunction No date: Hypertension No date: Pre-diabetes No date: Urinary hesitancy Past Surgical History: No date: TONSILLECTOMY     Comment:  at the age of 4 BMI    Body Mass Index: 27.47 kg/m     Reproductive/Obstetrics negative OB ROS                             Anesthesia Physical Anesthesia Plan  ASA: 3  Anesthesia Plan: General ETT   Post-op Pain Management:    Induction:   PONV Risk Score and Plan: 3  Airway Management Planned:   Additional Equipment:   Intra-op Plan:   Post-operative Plan:   Informed Consent: I have reviewed the patients History and Physical, chart, labs and discussed the procedure including the risks, benefits and alternatives for the proposed anesthesia with  the patient or authorized representative who has indicated his/her understanding and acceptance.     Dental Advisory Given  Plan Discussed with: CRNA  Anesthesia Plan Comments:        Anesthesia Quick Evaluation

## 2023-06-12 ENCOUNTER — Encounter: Payer: Self-pay | Admitting: Surgery

## 2023-06-16 ENCOUNTER — Ambulatory Visit: Payer: PPO | Admitting: Urology

## 2023-06-26 ENCOUNTER — Encounter: Payer: Self-pay | Admitting: Surgery

## 2023-06-26 ENCOUNTER — Ambulatory Visit: Payer: PPO | Admitting: Surgery

## 2023-06-26 VITALS — BP 148/79 | HR 78 | Temp 98.0°F | Ht 69.0 in | Wt 186.0 lb

## 2023-06-26 DIAGNOSIS — K4090 Unilateral inguinal hernia, without obstruction or gangrene, not specified as recurrent: Secondary | ICD-10-CM

## 2023-06-26 DIAGNOSIS — Z8719 Personal history of other diseases of the digestive system: Secondary | ICD-10-CM | POA: Insufficient documentation

## 2023-06-26 DIAGNOSIS — Z09 Encounter for follow-up examination after completed treatment for conditions other than malignant neoplasm: Secondary | ICD-10-CM

## 2023-06-26 NOTE — Patient Instructions (Addendum)
 Follow-up with our office as needed. ? ?Please call and ask to speak with a nurse if you develop questions or concerns. ? ? ?GENERAL POST-OPERATIVE ?PATIENT INSTRUCTIONS  ? ?WOUND CARE INSTRUCTIONS:  Keep a dry clean dressing on the wound if there is drainage. The initial bandage may be removed after 24 hours.  Once the wound has quit draining you may leave it open to air.  If clothing rubs against the wound or causes irritation and the wound is not draining you may cover it with a dry dressing during the daytime.  Try to keep the wound dry and avoid ointments on the wound unless directed to do so.  If the wound becomes bright red and painful or starts to drain infected material that is not clear, please contact your physician immediately.  If the wound is mildly pink and has a thick firm ridge underneath it, this is normal, and is referred to as a healing ridge.  This will resolve over the next 4-6 weeks. ? ?BATHING: ?You may shower if you have been informed of this by your surgeon. However, Please do not submerge in a tub, hot tub, or pool until incisions are completely sealed or have been told by your surgeon that you may do so. ? ?DIET:  You may eat any foods that you can tolerate.  It is a good idea to eat a high fiber diet and take in plenty of fluids to prevent constipation.  If you do become constipated you may want to take a mild laxative or take ducolax tablets on a daily basis until your bowel habits are regular.  Constipation can be very uncomfortable, along with straining, after recent surgery. ? ?ACTIVITY:  You are encouraged to cough and deep breath or use your incentive spirometer if you were given one, every 15-30 minutes when awake.  This will help prevent respiratory complications and low grade fevers post-operatively if you had a general anesthetic.  You may want to hug a pillow when coughing and sneezing to add additional support to the surgical area, if you had abdominal or chest surgery, which  will decrease pain during these times.  You are encouraged to walk and engage in light activity for the next two weeks.  You should not lift more than 20 pounds for 6 weeks total after surgery as it could put you at increased risk for complications.  Twenty pounds is roughly equivalent to a plastic bag of groceries. At that time- Listen to your body when lifting, if you have pain when lifting, stop and then try again in a few days. Soreness after doing exercises or activities of daily living is normal as you get back in to your normal routine. ? ?MEDICATIONS:  Try to take narcotic medications and anti-inflammatory medications, such as tylenol , ibuprofen , naprosyn, etc., with food.  This will minimize stomach upset from the medication.  Should you develop nausea and vomiting from the pain medication, or develop a rash, please discontinue the medication and contact your physician.  You should not drive, make important decisions, or operate machinery when taking narcotic pain medication. ? ?SUNBLOCK ?Use sun block to incision area over the next year if this area will be exposed to sun. This helps decrease scarring and will allow you avoid a permanent darkened area over your incision. ? ?QUESTIONS:  Please feel free to call our office if you have any questions, and we will be glad to assist you. [phone] ? ? ?

## 2023-06-26 NOTE — Progress Notes (Signed)
 [facility]  06/26/2023  HPI: Austin Ellis is a 83 y.o. male had surgery on June 11, 2023, now s/p robotic left inguinal hernia repair with mesh.  Doing great, little to no pain, barely took any ibuprofen.  Vital signs: BP (!) 148/79   Pulse 78   Temp 98 F (36.7 C)   Ht 5' 9 (1.753 m)   Wt 186 lb (84.4 kg)   SpO2 97%   BMI 27.47 kg/m    Physical Exam: Constitutional: Appears well, no complaints. Abdomen: All his incisions are clean dry and intact, abdomen is soft benign nontender.  There is minimal swelling in the left groin, and nothing changes with Valsalva.  Assessment/Plan: Austin Ellis is a 83 y.o. male had surgery on June 11, 2023, now s/p robotic left inguinal hernia repair with mesh.  Patient Active Problem List   Diagnosis Date Noted   Left inguinal hernia 05/09/2023   BPH with obstruction/lower urinary tract symptoms 12/08/2014   Elevated PSA 12/08/2014   Erectile dysfunction of organic origin 12/08/2014    -Free to progress with activity as desired, free to drive.  Will be glad to see this pleasant gentleman anytime as needed.   Honor Leghorn M.D., FACS 06/26/2023, 2:23 PM

## 2023-07-24 NOTE — Progress Notes (Signed)
 07/24/23 12:26 PM   Austin Ellis June 07, 1940 [id number]  Referring provider:  Dortha Kern, MD [address] [place],  [place] [address]  Urological history  Elevated PSA  -PSA (09/2022) 1.83 (3.66)  -PSA (2015) 5.4 - prostate biopsy negative -PSA (2016) 4.0 - started on finasteride 5 mg daily   2. BPH with LUTS  -cysto (10/2022) -enlarged prostate with bilobar coaptation and mildly elevated bladder neck, no median lobe, prostatic hypervascularity -finasteride 5 mg daily and tamsulosin 0.4 mg daily   3. ED  -contributing factors of age, HTN, BPH and history of smoking  4. Family history of prostate cancer  -father   HPI: Austin Ellis is a 83 y.o.male who presents today for follow up.    Previous records reviewed.  At his visit on 04/2023, I PSS 15/4.  PVR 0 mL.  His biggest thing is his scrotal swelling.  He has found to be uncomfortable and interfere with wearing clothing.  He also states his penis is covered by the hydroceles.  He cannot tolerate the weak urinary stream.  He states it actually gets stronger as the day goes on.  Patient denies any modifying or aggravating factors.  Patient denies any recent UTI's, gross hematuria, dysuria or suprapubic/flank pain.  Patient denies any fevers, chills, nausea or vomiting.    Scrotal ultrasound (05/19/2023) -a left intratesticular simple cyst, bilateral epididymal head simple cysts, large bilateral hydroceles and a left inguinal hernia.  At his visit on 06/02/2023, He is scheduled to undergo a robotic assisted left inguinal hernia repair on June 11, 2023.  We reviewed the results of his scrotal ultrasound.  He still is finding hydroceles bothersome.  He is also interested in undergoing a bladder outlet procedure in the future..  Patient denies any modifying or aggravating factors.  Patient denies any recent UTI's, gross hematuria, dysuria or suprapubic/flank pain.  Patient denies any fevers, chills, nausea or vomiting.     He states he has been doing well since his herniorrhaphy.  He states that the hydroceles are actually decreased in size and his stream has been stronger.  He would like to hold off on any hydrocelectomy or bladder outlet procedure at this time.  Patient denies any modifying or aggravating factors.  Patient denies any recent UTI's, gross hematuria, dysuria or suprapubic/flank pain.  Patient denies any fevers, chills, nausea or vomiting.     PMH: Past Medical History:  Diagnosis Date   Arthritis    Benign enlargement of prostate    Chronic back pain    Elevated PSA    underwent TRUSBx on 05/28/2013 for a PSA of 5.4 ng/ml   Erectile dysfunction    Hypertension    Left inguinal hernia 05/09/2023   Pre-diabetes    Urinary hesitancy     Surgical History: Past Surgical History:  Procedure Laterality Date   INSERTION OF MESH Left 06/11/2023   Procedure: INSERTION OF MESH;  Surgeon: Campbell Lerner, MD;  Location: [facility] ORS;  Service: General;  Laterality: Left;   TONSILLECTOMY     at the age of 4    Home Medications:  Allergies as of 07/28/2023       Reactions   Cialis [tadalafil]    Back Pain        Medication List        Accurate as of July 24, 2023 12:26 PM. If you have any questions, ask your nurse or doctor.          acetaminophen  500 MG tablet Commonly known as: TYLENOL Take 500-1,000 mg by mouth every 6 (six) hours as needed (pain.).   finasteride 5 MG tablet Commonly known as: PROSCAR TAKE ONE TABLET BY MOUTH DAILY.   furosemide 20 MG tablet Commonly known as: LASIX Take 20 mg by mouth daily.   Ibuprofen 200 MG Caps Take 200-400 mg by mouth every 6 (six) hours as needed (pain.).   losartan 100 MG tablet Commonly known as: COZAAR Take 100 mg by mouth at bedtime.   potassium chloride 10 MEQ tablet Commonly known as: KLOR-CON M Take 1 tablet (10 mEq total) by mouth 2 (two) times daily.   tamsulosin 0.4 MG Caps capsule Commonly known as:  FLOMAX TAKE (2) CAPSULES BY MOUTH EVERY DAY What changed: See the new instructions.   Vitamin B 12 500 MCG Tabs Take 1,000 mcg by mouth daily.        Allergies:  Allergies  Allergen Reactions   Cialis [Tadalafil]     Back Pain    Family History: Family History  Problem Relation Age of Onset   Prostate cancer Father    Lung cancer Father    Kidney disease Neg Hx    Bladder Cancer Neg Hx    Kidney cancer Neg Hx     Social History:  reports that he quit smoking about 29 years ago. His smoking use included cigarettes. He started smoking about 60 years ago. He has a 7.8 pack-year smoking history. He has been exposed to tobacco smoke. He has never used smokeless tobacco. He reports that he does not drink alcohol and does not use drugs.   Physical Exam: There were no vitals taken for this visit.  Constitutional:  Well nourished. Alert and oriented, No acute distress. HEENT: [place] AT, moist mucus membranes.  Trachea midline Cardiovascular: No clubbing, cyanosis, or edema. Respiratory: Normal respiratory effort, no increased work of breathing. Neurologic: Grossly intact, no focal deficits, moving all 4 extremities. Psychiatric: Normal mood and affect.   Laboratory Data: Results for orders placed or performed during the hospital encounter of 06/11/23  I-STAT, chem 8   Collection Time: 06/11/23  6:36 AM  Result Value Ref Range   Sodium 139 135 - 145 mmol/L   Potassium 3.9 3.5 - 5.1 mmol/L   Chloride 107 98 - 111 mmol/L   BUN 30 (H) 8 - 23 mg/dL   Creatinine, Ser 1.32 (H) 0.61 - 1.24 mg/dL   Glucose, Bld 440 (H) 70 - 99 mg/dL   Calcium, Ion 1.02 (L) 1.15 - 1.40 mmol/L   TCO2 21 (L) 22 - 32 mmol/L   Hemoglobin 13.9 13.0 - 17.0 g/dL   HCT 72.5 36.6 - 44.0 %  I have reviewed the labs.  See HPI.     Pertinent Imaging N/A  Assessment & Plan:    1.  Bilateral hydroceles -Would like to continue to manage conservatively  2.  Left inguinal hernia -Robotic left inguinal  hernia repair on January 22  3. BPH with LUTS -most bothersome symptoms is weak urinary stream  -continue conservative management, avoiding bladder irritants and timed voiding's -Continue tamsulosin 0.4 mg daily and finasteride 5 mg daily -He would like to postpone any bladder outlet procedure at this time citing potential cost of the hernia repair and he feels his symptoms have improved since the hernia repair -Will revisit when he returns in 1 year  4.  Erectile dysfunction -He is no longer sexually active  Michiel Cowboy, PA-C   [facility] [address], [address] [place], [place] [address] [phone]

## 2023-07-28 ENCOUNTER — Encounter: Payer: Self-pay | Admitting: Urology

## 2023-07-28 ENCOUNTER — Ambulatory Visit: Payer: PPO | Admitting: Urology

## 2023-07-28 VITALS — BP 175/76 | HR 83 | Ht 69.0 in | Wt 184.6 lb

## 2023-07-28 DIAGNOSIS — N401 Enlarged prostate with lower urinary tract symptoms: Secondary | ICD-10-CM

## 2023-07-28 DIAGNOSIS — N138 Other obstructive and reflux uropathy: Secondary | ICD-10-CM

## 2023-07-28 DIAGNOSIS — N433 Hydrocele, unspecified: Secondary | ICD-10-CM | POA: Diagnosis not present

## 2024-01-05 ENCOUNTER — Other Ambulatory Visit: Payer: Self-pay | Admitting: Urology

## 2024-01-05 DIAGNOSIS — N401 Enlarged prostate with lower urinary tract symptoms: Secondary | ICD-10-CM

## 2024-05-25 ENCOUNTER — Encounter: Payer: Self-pay | Admitting: Urology

## 2024-08-02 ENCOUNTER — Ambulatory Visit: Admitting: Urology

## 2024-09-21 ENCOUNTER — Ambulatory Visit: Admitting: Urology
